# Patient Record
Sex: Female | Born: 1960 | Race: White | Hispanic: No | Marital: Married | Smoking: Former smoker
Health system: Southern US, Community
[De-identification: ages and names within clinical notes are randomized; demographics above are authoritative.]

## PROBLEM LIST (undated history)

## (undated) DIAGNOSIS — F329 Major depressive disorder, single episode, unspecified: Secondary | ICD-10-CM

## (undated) DIAGNOSIS — E119 Type 2 diabetes mellitus without complications: Secondary | ICD-10-CM

## (undated) DIAGNOSIS — F419 Anxiety disorder, unspecified: Secondary | ICD-10-CM

## (undated) DIAGNOSIS — G43909 Migraine, unspecified, not intractable, without status migrainosus: Secondary | ICD-10-CM

## (undated) DIAGNOSIS — F32A Depression, unspecified: Secondary | ICD-10-CM

## (undated) DIAGNOSIS — E079 Disorder of thyroid, unspecified: Secondary | ICD-10-CM

## (undated) HISTORY — DX: Disorder of thyroid, unspecified: E07.9

## (undated) HISTORY — DX: Depression, unspecified: F32.A

## (undated) HISTORY — PX: TONSILLECTOMY: SUR1361

## (undated) HISTORY — PX: BACK SURGERY: SHX140

## (undated) HISTORY — DX: Migraine, unspecified, not intractable, without status migrainosus: G43.909

## (undated) HISTORY — PX: CHOLECYSTECTOMY: SHX55

## (undated) HISTORY — DX: Anxiety disorder, unspecified: F41.9

## (undated) HISTORY — DX: Type 2 diabetes mellitus without complications: E11.9

## (undated) HISTORY — DX: Major depressive disorder, single episode, unspecified: F32.9

## (undated) HISTORY — PX: GYNECOLOGIC CRYOSURGERY: SHX857

## (undated) HISTORY — PX: LAPAROSCOPY: SHX197

---

## 1999-09-03 ENCOUNTER — Encounter: Payer: Self-pay | Admitting: Internal Medicine

## 1999-09-03 ENCOUNTER — Ambulatory Visit (HOSPITAL_COMMUNITY): Admission: RE | Admit: 1999-09-03 | Discharge: 1999-09-03 | Payer: Self-pay | Admitting: Internal Medicine

## 2002-08-05 ENCOUNTER — Emergency Department (HOSPITAL_COMMUNITY): Admission: AD | Admit: 2002-08-05 | Discharge: 2002-08-05 | Payer: Self-pay | Admitting: Emergency Medicine

## 2002-08-05 ENCOUNTER — Encounter: Payer: Self-pay | Admitting: Emergency Medicine

## 2004-05-27 ENCOUNTER — Emergency Department (HOSPITAL_COMMUNITY): Admission: EM | Admit: 2004-05-27 | Discharge: 2004-05-28 | Payer: Self-pay | Admitting: Emergency Medicine

## 2004-11-27 ENCOUNTER — Ambulatory Visit: Payer: Self-pay | Admitting: Internal Medicine

## 2004-12-25 ENCOUNTER — Ambulatory Visit: Payer: Self-pay | Admitting: Internal Medicine

## 2005-03-18 ENCOUNTER — Ambulatory Visit: Payer: Self-pay | Admitting: Internal Medicine

## 2005-07-16 ENCOUNTER — Ambulatory Visit: Payer: Self-pay | Admitting: Internal Medicine

## 2005-08-31 ENCOUNTER — Ambulatory Visit: Payer: Self-pay | Admitting: Internal Medicine

## 2006-01-13 ENCOUNTER — Ambulatory Visit: Payer: Self-pay | Admitting: Internal Medicine

## 2006-03-21 ENCOUNTER — Ambulatory Visit: Payer: Self-pay | Admitting: Internal Medicine

## 2006-04-28 ENCOUNTER — Ambulatory Visit: Payer: Self-pay | Admitting: Endocrinology

## 2006-06-10 ENCOUNTER — Ambulatory Visit (HOSPITAL_COMMUNITY): Admission: RE | Admit: 2006-06-10 | Discharge: 2006-06-10 | Payer: Self-pay | Admitting: Oncology

## 2006-10-05 ENCOUNTER — Ambulatory Visit: Payer: Self-pay | Admitting: Internal Medicine

## 2006-10-27 ENCOUNTER — Ambulatory Visit: Payer: Self-pay | Admitting: Internal Medicine

## 2006-12-05 ENCOUNTER — Encounter: Payer: Self-pay | Admitting: *Deleted

## 2006-12-05 DIAGNOSIS — G43909 Migraine, unspecified, not intractable, without status migrainosus: Secondary | ICD-10-CM | POA: Insufficient documentation

## 2006-12-05 DIAGNOSIS — F331 Major depressive disorder, recurrent, moderate: Secondary | ICD-10-CM | POA: Insufficient documentation

## 2006-12-05 DIAGNOSIS — F4323 Adjustment disorder with mixed anxiety and depressed mood: Secondary | ICD-10-CM

## 2007-01-11 ENCOUNTER — Ambulatory Visit: Payer: Self-pay | Admitting: Internal Medicine

## 2007-01-11 ENCOUNTER — Encounter (INDEPENDENT_AMBULATORY_CARE_PROVIDER_SITE_OTHER): Payer: Self-pay | Admitting: *Deleted

## 2007-01-11 DIAGNOSIS — F519 Sleep disorder not due to a substance or known physiological condition, unspecified: Secondary | ICD-10-CM | POA: Insufficient documentation

## 2007-01-16 LAB — CONVERTED CEMR LAB
ALT: 31 units/L (ref 0–35)
Alkaline Phosphatase: 73 units/L (ref 39–117)
BUN: 10 mg/dL (ref 6–23)
Basophils Absolute: 0 10*3/uL (ref 0.0–0.1)
Bilirubin, Direct: 0.1 mg/dL (ref 0.0–0.3)
Calcium: 9.2 mg/dL (ref 8.4–10.5)
Eosinophils Absolute: 0.2 10*3/uL (ref 0.0–0.6)
GFR calc Af Amer: 99 mL/min
GFR calc non Af Amer: 82 mL/min
Lymphocytes Relative: 28.4 % (ref 12.0–46.0)
MCV: 89.5 fL (ref 78.0–100.0)
Monocytes Relative: 6 % (ref 3.0–11.0)
Neutro Abs: 5.5 10*3/uL (ref 1.4–7.7)
Platelets: 212 10*3/uL (ref 150–400)

## 2007-01-31 ENCOUNTER — Ambulatory Visit: Payer: Self-pay | Admitting: Internal Medicine

## 2007-01-31 DIAGNOSIS — E063 Autoimmune thyroiditis: Secondary | ICD-10-CM

## 2007-12-22 ENCOUNTER — Emergency Department (HOSPITAL_COMMUNITY): Admission: EM | Admit: 2007-12-22 | Discharge: 2007-12-22 | Payer: Self-pay | Admitting: Emergency Medicine

## 2009-06-20 ENCOUNTER — Emergency Department (HOSPITAL_COMMUNITY): Admission: EM | Admit: 2009-06-20 | Discharge: 2009-06-20 | Payer: Self-pay | Admitting: Emergency Medicine

## 2009-07-02 ENCOUNTER — Encounter: Payer: Self-pay | Admitting: Internal Medicine

## 2009-11-21 ENCOUNTER — Encounter: Payer: Self-pay | Admitting: Internal Medicine

## 2009-11-25 ENCOUNTER — Inpatient Hospital Stay (HOSPITAL_COMMUNITY): Admission: AD | Admit: 2009-11-25 | Discharge: 2009-11-26 | Payer: Self-pay | Admitting: *Deleted

## 2009-11-25 ENCOUNTER — Encounter: Payer: Self-pay | Admitting: Internal Medicine

## 2009-12-30 ENCOUNTER — Telehealth (INDEPENDENT_AMBULATORY_CARE_PROVIDER_SITE_OTHER): Payer: Self-pay | Admitting: *Deleted

## 2010-04-09 NOTE — Letter (Signed)
Summary: [facility]  [facility]   Imported By: Sherian Rein 07/18/2009 14:55:38  _____________________________________________________________________  External Attachment:    Type:   Image     Comment:   External Document

## 2010-04-09 NOTE — Letter (Signed)
Summary: [facility]  [facility]   Imported By: Sherian Rein 12/02/2009 15:07:58  _____________________________________________________________________  External Attachment:    Type:   Image     Comment:   External Document

## 2010-04-09 NOTE — Letter (Signed)
Summary: [facility]  [facility]   Imported By: Lester Ewa Gentry 12/08/2009 07:23:52  _____________________________________________________________________  External Attachment:    Type:   Image     Comment:   External Document

## 2010-04-09 NOTE — Progress Notes (Signed)
  Phone Note Other Incoming   Request: Send information Summary of Call: Request for records received from [employer]. Request forwarded to [employer].     

## 2010-04-11 ENCOUNTER — Encounter: Payer: Self-pay | Admitting: Family Medicine

## 2010-04-11 ENCOUNTER — Ambulatory Visit (INDEPENDENT_AMBULATORY_CARE_PROVIDER_SITE_OTHER): Payer: PRIVATE HEALTH INSURANCE | Admitting: Family Medicine

## 2010-04-11 DIAGNOSIS — J069 Acute upper respiratory infection, unspecified: Secondary | ICD-10-CM

## 2010-04-13 ENCOUNTER — Telehealth: Payer: Self-pay | Admitting: Internal Medicine

## 2010-04-15 ENCOUNTER — Ambulatory Visit (INDEPENDENT_AMBULATORY_CARE_PROVIDER_SITE_OTHER): Payer: PRIVATE HEALTH INSURANCE | Admitting: Internal Medicine

## 2010-04-15 ENCOUNTER — Encounter: Payer: Self-pay | Admitting: Internal Medicine

## 2010-04-15 DIAGNOSIS — R05 Cough: Secondary | ICD-10-CM

## 2010-04-15 DIAGNOSIS — J019 Acute sinusitis, unspecified: Secondary | ICD-10-CM

## 2010-04-23 NOTE — Assessment & Plan Note (Signed)
Summary: FEVER,COUGH/CONGESTION   Vital Signs:  Patient profile:   50 year old female Height:      69 inches Weight:      216.25 pounds BMI:     32.05 Temp:     98.0 degrees F oral Pulse rate:   80 / minute Pulse rhythm:   regular BP sitting:   108 / 60  (left arm) Cuff size:   large  Vitals Entered By: Selena Batten Dance CMA Duncan Dull) (April 11, 2010 9:44 AM) CC: Fever, cough   History of Present Illness: started getting sick thurs am - migraine type ha as the day went on started coughing and feeling sick  fever started 100.3  nasal congestion / sore throat and ear pain  no fever right now -- last ibuprofen last night - nothing today   cough is dry   grandkids had a stomach bug and also colds   some loose stool no further vomiting - but poor appetite   taking ibuprofen and delsym for symptoms   Allergies (verified): No Known Drug Allergies  Past History:  Past Medical History: Last updated: 01/31/2007 Depression/ greif Hypertension Anxiety migraines Hypothyroidism 01-Aug-2006  Past Surgical History: Last updated: 01/11/2007 Tonsillectomy Cholecystectomy  Family History: Last updated: 01/11/2007 Family History Lung cancer, heart arrythmia mother with lung cancer  Social History: Last updated: 01/31/2007 Former Smoker Alcohol use-yes 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 Eyes:  Denies blurring, double vision, and eye irritation. ENT:  Complains of nasal congestion, postnasal drainage, sinus pressure, and sore throat. CV:  Denies chest pain or discomfort and palpitations. Resp:  Complains of cough and sputum productive; denies pleuritic, shortness of breath, and wheezing. GI:  Denies diarrhea, nausea, and vomiting. Derm:  Denies itching, lesion(s), poor wound healing, and rash. Neuro:  Denies numbness and tingling.  Physical  Exam  General:  overweight but generally well appearing  Head:  normocephalic, atraumatic, and no abnormalities observed.  no sinus tenderness Eyes:  vision grossly intact, pupils equal, pupils round, pupils reactive to light, and no injection.   Ears:  TMs dull bilat  Nose:  nares are injected and congested bilaterally  Mouth:  pharynx pink and moist, no erythema, and no exudates.   Neck:  supple with full rom and no masses or thyromegally, no JVD or carotid bruit  Lungs:  Normal respiratory effort, chest expands symmetrically. Lungs are clear to auscultation, no crackles or wheezes. Heart:  Normal rate and regular rhythm. S1 and S2 normal without gallop, murmur, click, rub or other extra sounds. Skin:  Intact without suspicious lesions or rashes Cervical Nodes:  No lymphadenopathy noted Psych:  normal affect, talkative and pleasant    Impression & Recommendations:  Problem # 1:  VIRAL URI (ICD-465.9) Assessment New with fever - now regressing and nasal and chest congestion  recommend sympt care- see pt instructions   pt advised to update me if symptoms worsen or do not improve - esp if worse fever or cough guifen - codiene as needed - warned of sedation  Her updated medication list for this problem includes:    Guaifenesin Ac 100-10 Mg/30ml Syrp (Guaifenesin-codeine) .Marland Kitchen... 1-2 teaspoons up to every 4-6 hours as needed cough -at night or when not driving - will sedate  Complete Medication List: 1)  Guaifenesin Ac 100-10 Mg/49ml Syrp (Guaifenesin-codeine) .Marland Kitchen.. 1-2 teaspoons up to every 4-6 hours  as needed cough -at night or when not driving - will sedate  Patient Instructions: 1)  I think you have a viral syndrome  2)  you can try mucinex over the counter twice daily as directed and nasal saline spray for congestion 3)  tylenol over the counter as directed may help with aches, headache and fever 4)  call if symptoms worsen or if not improved in 4-5 days 5)  try the codiene cough  syrup at night or when not driving Prescriptions: GUAIFENESIN AC 100-10 MG/5ML SYRP (GUAIFENESIN-CODEINE) 1-2 teaspoons up to every 4-6 hours as needed cough -at night or when not driving - will sedate  #161WR x 0   Entered and Authorized by:   Judith Part MD   Signed by:   Judith Part MD on 04/11/2010   Method used:   Print then Give to Patient   RxID:   [id number]    Orders Added: 1)  Est. Patient Level III [95621]    Prior Medications: Current Allergies (reviewed today): No known allergies

## 2010-04-23 NOTE — Progress Notes (Signed)
Summary: REQ FOR RX  Phone Note Call from Patient   Summary of Call: Pt now coughing up green mucus and chest tightness. She is req rx for antibiotic - was seen a sat clinic and given rx for cough med only.  Initial call taken by: Lamar Sprinkles, CMA,  April 13, 2010 9:27 AM  Follow-up for Phone Call        ok z pac OV if sick Follow-up by: Tresa Garter MD,  April 13, 2010 6:02 PM  Additional Follow-up for Phone Call Additional follow up Details #1::        Pt informed  Additional Follow-up by: Lamar Sprinkles, CMA,  April 13, 2010 6:53 PM    New/Updated Medications: ZITHROMAX Z-PAK 250 MG TABS (AZITHROMYCIN) as dirrected Prescriptions: ZITHROMAX Z-PAK 250 MG TABS (AZITHROMYCIN) as dirrected  #1 x 0   Entered by:   Lamar Sprinkles, CMA   Authorized by:   Tresa Garter MD   Signed by:   Lamar Sprinkles, CMA on 04/13/2010   Method used:   Electronically to        [facility] (retail)       [address]       [place], [place]  [address]       Ph: [phone]       Fax: [phone]   RxID:   [id number]

## 2010-04-23 NOTE — Assessment & Plan Note (Signed)
Summary: LAST OV 2008--COUGH-CONGESTION  STC   Vital Signs:  Patient profile:   50 year old female Height:      69 inches Weight:      218 pounds BMI:     32.31 Temp:     98.8 degrees F oral Pulse rate:   88 / minute Pulse rhythm:   regular Resp:     16 per minute BP sitting:   158 / 120  (left arm) Cuff size:   large  Vitals Entered By: Lanier Prude, CMA(AAMA) (April 15, 2010 4:53 PM) CC: weakness, fatigue, aches, cough and congestion Is Patient Diabetic? No   CC:  weakness, fatigue, aches, and cough and congestion.  History of Present Illness: The patient presents with complaints of sore throat, fever, cough, sinus congestion and drainge of seven days duration. Not better with OTC meds. Muscle aches are present.  The mucus is colored.   Current Medications (verified): 1)  Guaifenesin Ac 100-10 Mg/32ml Syrp (Guaifenesin-Codeine) .Marland Kitchen.. 1-2 Teaspoons Up To Every 4-6 Hours As Needed Cough -At Night or When Not Driving - Will Sedate 2)  Zithromax Z-Pak 250 Mg Tabs (Azithromycin) .... As Dirrected 3)  Mucinex 600 Mg Xr12h-Tab (Guaifenesin) .... 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  The patient denies chest pain and prolonged cough.         cough  Physical Exam  General:  Looks tired Mouth:  Erythematous throat and intranasal mucosa c/w URI  Lungs:  Normal respiratory effort, chest expands symmetrically. Lungs are clear to auscultation, no crackles or wheezes. Heart:  Normal rate and regular rhythm. S1 and S2 normal without gallop, murmur, click, rub or other extra sounds. Skin:  Intact without suspicious lesions or rashes Psych:   normal affect, talkative and pleasant    Impression & Recommendations:  Problem # 1:  SINUSITIS, ACUTE (ICD-461.9) Assessment New  The following medications were removed from the medication list:    Guaifenesin Ac 100-10 Mg/90ml Syrp (Guaifenesin-codeine) .Marland Kitchen... 1-2 teaspoons up to every 4-6 hours as needed cough -at night or when not driving - will sedate Her updated medication list for this problem includes:    Mucinex 600 Mg Xr12h-tab (Guaifenesin) .Marland Kitchen... As directed    Tussionex Pennkinetic Er 8-10 Mg/30ml Lqcr (Chlorpheniramine-hydrocodone) .Marland KitchenMarland KitchenMarland KitchenMarland Kitchen 5 ml by mouth two times a day as needed cough    Augmentin 875-125 Mg Tabs (Amoxicillin-pot clavulanate) .Marland Kitchen... 1 by mouth bid  Problem # 2:  COUGH (ICD-786.2) due to #1 Assessment: New  Complete Medication List: 1)  Mucinex 600 Mg Xr12h-tab (Guaifenesin) .... As directed 2)  Tussionex Pennkinetic Er 8-10 Mg/85ml Lqcr (Chlorpheniramine-hydrocodone) .... 5 ml by mouth two times a day as needed cough 3)  Augmentin 875-125 Mg Tabs (Amoxicillin-pot clavulanate) .Marland Kitchen.. 1 by mouth bid  Patient Instructions: 1)  Use over-the-counter medicines for "cold": Tylenol  650mg  or Advil 400mg  every 6 hours  for fever; Delsym or Robutussin for cough. Mucinex  for congestion. Ricola or Halls for sore throat. Office visit if not better or if worse.  2)  Check BP at home 3)  Use the Sinus rinse as needed  Prescriptions: AUGMENTIN 875-125 MG TABS (AMOXICILLIN-POT CLAVULANATE) 1 by mouth bid  #  20 x 0   Entered and Authorized by:   Tresa Garter MD   Signed by:   Tresa Garter MD on 04/15/2010   Method used:   Print then Give to Patient   RxID:   [id number] TUSSIONEX PENNKINETIC ER 8-10 MG/5ML LQCR (CHLORPHENIRAMINE-HYDROCODONE) 5 ml by mouth two times a day as needed cough  #100 ml x 0   Entered and Authorized by:   Tresa Garter MD   Signed by:   Tresa Garter MD on 04/15/2010   Method used:   Print then Give to Patient   RxID:    [id number]    Orders Added: 1)  Est. Patient Level III [24401]

## 2010-05-21 LAB — URINALYSIS, ROUTINE W REFLEX MICROSCOPIC
Bilirubin Urine: NEGATIVE
Glucose, UA: NEGATIVE mg/dL
Hgb urine dipstick: NEGATIVE
Ketones, ur: NEGATIVE mg/dL
Protein, ur: NEGATIVE mg/dL

## 2010-05-21 LAB — CBC
Hemoglobin: 14.6 g/dL (ref 12.0–15.0)
MCH: 31.7 pg (ref 26.0–34.0)
MCV: 90 fL (ref 78.0–100.0)
RBC: 4.61 MIL/uL (ref 3.87–5.11)

## 2010-05-21 LAB — DIFFERENTIAL
Basophils Absolute: 0.1 10*3/uL (ref 0.0–0.1)
Basophils Relative: 1 % (ref 0–1)
Eosinophils Absolute: 0.1 10*3/uL (ref 0.0–0.7)
Eosinophils Relative: 2 % (ref 0–5)
Lymphocytes Relative: 29 % (ref 12–46)

## 2010-05-21 LAB — COMPREHENSIVE METABOLIC PANEL
ALT: 27 U/L (ref 0–35)
AST: 34 U/L (ref 0–37)
Alkaline Phosphatase: 77 U/L (ref 39–117)
CO2: 27 mEq/L (ref 19–32)
Chloride: 103 mEq/L (ref 96–112)
Creatinine, Ser: 0.83 mg/dL (ref 0.4–1.2)
GFR calc Af Amer: >60 mL/min (ref >60)
GFR calc non Af Amer: >60 mL/min (ref >60)
Total Bilirubin: 1.1 mg/dL (ref 0.3–1.2)

## 2010-05-21 LAB — ABO/RH: ABO/RH(D): O POS

## 2010-05-21 LAB — TYPE AND SCREEN

## 2010-05-21 LAB — PROTIME-INR: Prothrombin Time: 12.9 seconds (ref 11.6–15.2)

## 2010-05-21 LAB — SURGICAL PCR SCREEN: MRSA, PCR: NEGATIVE

## 2010-08-14 ENCOUNTER — Other Ambulatory Visit: Payer: Self-pay | Admitting: Obstetrics and Gynecology

## 2010-08-14 DIAGNOSIS — N644 Mastodynia: Secondary | ICD-10-CM

## 2010-08-18 ENCOUNTER — Ambulatory Visit
Admission: RE | Admit: 2010-08-18 | Discharge: 2010-08-18 | Disposition: A | Discharge status: Home / Self Care | Payer: PRIVATE HEALTH INSURANCE | Source: Ambulatory Visit | Attending: Obstetrics and Gynecology | Admitting: Obstetrics and Gynecology

## 2010-08-18 ENCOUNTER — Other Ambulatory Visit: Payer: Self-pay | Admitting: Obstetrics and Gynecology

## 2010-08-18 DIAGNOSIS — N644 Mastodynia: Secondary | ICD-10-CM

## 2010-08-25 ENCOUNTER — Telehealth: Payer: Self-pay | Admitting: Internal Medicine

## 2010-08-25 ENCOUNTER — Other Ambulatory Visit (INDEPENDENT_AMBULATORY_CARE_PROVIDER_SITE_OTHER): Payer: PRIVATE HEALTH INSURANCE

## 2010-08-25 ENCOUNTER — Ambulatory Visit (INDEPENDENT_AMBULATORY_CARE_PROVIDER_SITE_OTHER): Payer: PRIVATE HEALTH INSURANCE | Admitting: Internal Medicine

## 2010-08-25 ENCOUNTER — Encounter: Payer: Self-pay | Admitting: Internal Medicine

## 2010-08-25 ENCOUNTER — Other Ambulatory Visit (INDEPENDENT_AMBULATORY_CARE_PROVIDER_SITE_OTHER): Payer: PRIVATE HEALTH INSURANCE | Admitting: Internal Medicine

## 2010-08-25 VITALS — BP 130/90 | HR 80 | Temp 98.2°F | Resp 16 | Ht 69.0 in | Wt 203.0 lb

## 2010-08-25 DIAGNOSIS — Z0000 Encounter for general adult medical examination without abnormal findings: Secondary | ICD-10-CM

## 2010-08-25 DIAGNOSIS — G43009 Migraine without aura, not intractable, without status migrainosus: Secondary | ICD-10-CM

## 2010-08-25 DIAGNOSIS — R110 Nausea: Secondary | ICD-10-CM | POA: Insufficient documentation

## 2010-08-25 DIAGNOSIS — E039 Hypothyroidism, unspecified: Secondary | ICD-10-CM

## 2010-08-25 DIAGNOSIS — G43909 Migraine, unspecified, not intractable, without status migrainosus: Secondary | ICD-10-CM

## 2010-08-25 LAB — COMPREHENSIVE METABOLIC PANEL
AST: 22 U/L (ref 0–37)
Albumin: 4.3 g/dL (ref 3.5–5.2)
BUN: 9 mg/dL (ref 6–23)
Calcium: 9.1 mg/dL (ref 8.4–10.5)
Chloride: 107 mEq/L (ref 96–112)
Glucose, Bld: 121 mg/dL — ABNORMAL HIGH (ref 70–99)
Potassium: 4.2 mEq/L (ref 3.5–5.1)
Sodium: 138 mEq/L (ref 135–145)
Total Protein: 7.1 g/dL (ref 6.0–8.3)

## 2010-08-25 LAB — URINALYSIS, ROUTINE W REFLEX MICROSCOPIC
Nitrite: NEGATIVE
Specific Gravity, Urine: 1.02 (ref 1.000–1.030)
Urine Glucose: NEGATIVE
Urobilinogen, UA: 0.2 (ref 0.0–1.0)

## 2010-08-25 LAB — CBC WITH DIFFERENTIAL/PLATELET
Basophils Absolute: 0 10*3/uL (ref 0.0–0.1)
Basophils Relative: 0.6 % (ref 0.0–3.0)
Eosinophils Absolute: 0.1 10*3/uL (ref 0.0–0.7)
Lymphocytes Relative: 23.5 % (ref 12.0–46.0)
MCHC: 34.6 g/dL (ref 30.0–36.0)
Monocytes Absolute: 0.5 10*3/uL (ref 0.1–1.0)
Neutrophils Relative %: 69.3 % (ref 43.0–77.0)
Platelets: 202 10*3/uL (ref 150.0–400.0)
RBC: 4.54 Mil/uL (ref 3.87–5.11)
WBC: 8.4 10*3/uL (ref 4.5–10.5)

## 2010-08-25 LAB — TSH: TSH: 19.27 u[IU]/mL — ABNORMAL HIGH (ref 0.35–5.50)

## 2010-08-25 MED ORDER — CIPROFLOXACIN HCL 250 MG PO TABS: 250.0000 mg | ORAL_TABLET | Freq: Two times a day (BID) | ORAL | Status: AC

## 2010-08-25 MED ORDER — PROTRIPTYLINE HCL 10 MG PO TABS: ORAL_TABLET | ORAL | Status: DC

## 2010-08-25 MED ORDER — OXYCODONE-ACETAMINOPHEN 10-325 MG PO TABS: 1.0000 | ORAL_TABLET | Freq: Three times a day (TID) | ORAL | Status: DC

## 2010-08-25 MED ORDER — PROMETHAZINE HCL 12.5 MG PO TABS: 12.5000 mg | ORAL_TABLET | Freq: Four times a day (QID) | ORAL | Status: AC | PRN

## 2010-08-25 MED ORDER — VITAMIN D 1000 UNITS PO TABS: 1000.0000 [IU] | ORAL_TABLET | Freq: Every day | ORAL | Status: DC

## 2010-08-25 MED ORDER — LEVOTHYROXINE SODIUM 50 MCG PO TABS: 50.0000 ug | ORAL_TABLET | Freq: Every day | ORAL | Status: DC

## 2010-08-25 NOTE — Telephone Encounter (Signed)
Autumn Howard, please, inform patient that all labs are normal except for elev TSH and chol  Please, mail the labs to the patient. Start Lvoxyl 50 mcg a day. TSH in 6 wks

## 2010-08-25 NOTE — Assessment & Plan Note (Signed)
Worse. Start Vivactil - it helped before Triptan intolerant

## 2010-08-25 NOTE — Assessment & Plan Note (Signed)
Worse. Start Vivactil Advil Oxy/APAP for severe HAs

## 2010-08-25 NOTE — Progress Notes (Signed)
  Subjective:    Patient ID: Autumn Howard, female    DOB: 17-Dec-1960, 50 y.o.   MRN: [id number]  HPI C/o HA with nausea - bad (9/10) since this am. She took Percocet - it helped. C/o frequent migraines She sch well visit w/Dr Ambrose Mantle   Review of Systems  Constitutional: Positive for unexpected weight change (gained some wt). Negative for chills, activity change, appetite change and fatigue.  HENT: Negative for congestion, mouth sores and sinus pressure.   Eyes: Negative for visual disturbance.  Respiratory: Negative for cough and chest tightness.   Gastrointestinal: Negative for nausea and abdominal pain.  Genitourinary: Negative for frequency, difficulty urinating and vaginal pain.  Musculoskeletal: Negative for back pain and gait problem.  Skin: Negative for pallor and rash.  Neurological: Positive for headaches. Negative for dizziness, tremors, weakness and numbness.  Psychiatric/Behavioral: Negative for confusion and sleep disturbance.       Objective:   Physical Exam  Constitutional: She appears well-developed and well-nourished. No distress.       Obese, in NAD  HENT:  Head: Normocephalic.  Right Ear: External ear normal.  Left Ear: External ear normal.  Nose: Nose normal.  Mouth/Throat: Oropharynx is clear and moist.  Eyes: Conjunctivae are normal. Pupils are equal, round, and reactive to light. Right eye exhibits no discharge. Left eye exhibits no discharge.  Neck: Normal range of motion. Neck supple. No JVD present. No tracheal deviation present. No thyromegaly present.  Cardiovascular: Normal rate, regular rhythm and normal heart sounds.   Pulmonary/Chest: No stridor. No respiratory distress. She has no wheezes.  Abdominal: Soft. Bowel sounds are normal. She exhibits no distension and no mass. There is no tenderness. There is no rebound and no guarding.  Musculoskeletal: She exhibits no edema and no tenderness.       Neck is supple  Lymphadenopathy:    She has no  cervical adenopathy.  Neurological: She displays normal reflexes. No cranial nerve deficit. She exhibits normal muscle tone. Coordination normal.  Skin: No rash noted. No erythema.  Psychiatric: Her behavior is normal. Judgment and thought content normal.       sad          Assessment & Plan:

## 2010-08-25 NOTE — Telephone Encounter (Signed)
UTI - take Cipro

## 2010-08-25 NOTE — Assessment & Plan Note (Signed)
Due to HA Promethazine Rx

## 2010-08-26 NOTE — Telephone Encounter (Signed)
Pt informed

## 2010-10-09 ENCOUNTER — Other Ambulatory Visit (INDEPENDENT_AMBULATORY_CARE_PROVIDER_SITE_OTHER): Payer: PRIVATE HEALTH INSURANCE

## 2010-10-09 DIAGNOSIS — E039 Hypothyroidism, unspecified: Secondary | ICD-10-CM

## 2010-10-12 ENCOUNTER — Encounter: Payer: Self-pay | Admitting: Internal Medicine

## 2010-10-23 DIAGNOSIS — Z0279 Encounter for issue of other medical certificate: Secondary | ICD-10-CM

## 2010-10-27 ENCOUNTER — Telehealth: Payer: Self-pay | Admitting: *Deleted

## 2010-10-27 DIAGNOSIS — E039 Hypothyroidism, unspecified: Secondary | ICD-10-CM

## 2010-10-27 NOTE — Telephone Encounter (Signed)
Dr Debby Bud addressed on 8/6. TSH was better. I would take 75 mcg a day and recheck TSH in 1 mo Thx

## 2010-10-27 NOTE — Telephone Encounter (Signed)
Patient requesting results of labs.  

## 2010-10-28 MED ORDER — LEVOTHYROXINE SODIUM 75 MCG PO TABS: 75.0000 ug | ORAL_TABLET | Freq: Every day | ORAL | Status: DC

## 2010-10-28 NOTE — Telephone Encounter (Signed)
Pt informed

## 2011-04-26 ENCOUNTER — Telehealth: Payer: Self-pay | Admitting: Internal Medicine

## 2011-04-26 ENCOUNTER — Ambulatory Visit: Payer: PRIVATE HEALTH INSURANCE | Admitting: Internal Medicine

## 2011-04-26 ENCOUNTER — Other Ambulatory Visit (INDEPENDENT_AMBULATORY_CARE_PROVIDER_SITE_OTHER): Payer: PRIVATE HEALTH INSURANCE

## 2011-04-26 ENCOUNTER — Encounter: Payer: Self-pay | Admitting: Internal Medicine

## 2011-04-26 ENCOUNTER — Ambulatory Visit (INDEPENDENT_AMBULATORY_CARE_PROVIDER_SITE_OTHER): Payer: PRIVATE HEALTH INSURANCE | Admitting: Internal Medicine

## 2011-04-26 VITALS — BP 130/90 | HR 76 | Temp 97.7°F | Resp 16 | Wt 197.0 lb

## 2011-04-26 DIAGNOSIS — E039 Hypothyroidism, unspecified: Secondary | ICD-10-CM

## 2011-04-26 DIAGNOSIS — F329 Major depressive disorder, single episode, unspecified: Secondary | ICD-10-CM

## 2011-04-26 DIAGNOSIS — F3289 Other specified depressive episodes: Secondary | ICD-10-CM

## 2011-04-26 DIAGNOSIS — G43009 Migraine without aura, not intractable, without status migrainosus: Secondary | ICD-10-CM

## 2011-04-26 DIAGNOSIS — G43909 Migraine, unspecified, not intractable, without status migrainosus: Secondary | ICD-10-CM

## 2011-04-26 LAB — TSH: TSH: 11.05 u[IU]/mL — ABNORMAL HIGH (ref 0.35–5.50)

## 2011-04-26 LAB — URINALYSIS, ROUTINE W REFLEX MICROSCOPIC
Ketones, ur: NEGATIVE
Leukocytes, UA: NEGATIVE
Specific Gravity, Urine: 1.02 (ref 1.000–1.030)
Urine Glucose: NEGATIVE
pH: 5 (ref 5.0–8.0)

## 2011-04-26 LAB — BASIC METABOLIC PANEL
CO2: 28 mEq/L (ref 19–32)
Chloride: 102 mEq/L (ref 96–112)
GFR: 89.16 mL/min (ref >60.00)
Glucose, Bld: 127 mg/dL — ABNORMAL HIGH (ref 70–99)
Potassium: 3.8 mEq/L (ref 3.5–5.1)
Sodium: 136 mEq/L (ref 135–145)

## 2011-04-26 MED ORDER — LEVOTHYROXINE SODIUM 112 MCG PO TABS: 112.0000 ug | ORAL_TABLET | Freq: Every day | ORAL | Status: DC

## 2011-04-26 MED ORDER — PROTRIPTYLINE HCL 10 MG PO TABS: 40.0000 mg | ORAL_TABLET | Freq: Every day | ORAL | Status: DC

## 2011-04-26 MED ORDER — LEVOTHYROXINE SODIUM 75 MCG PO TABS: 75.0000 ug | ORAL_TABLET | Freq: Every day | ORAL | Status: DC

## 2011-04-26 NOTE — Assessment & Plan Note (Signed)
Check labs 

## 2011-04-26 NOTE — Telephone Encounter (Signed)
Misty Stanley, please, inform patient that all labs are normal except for elev glu and elev TSH Start synthroid 112 mcg a day Check tsh in 6 wks Thx

## 2011-04-26 NOTE — Assessment & Plan Note (Signed)
Chronic - on Vivactil Percocet prn

## 2011-04-26 NOTE — Progress Notes (Signed)
  Subjective:    Patient ID: Autumn Howard, female    DOB: Aug 02, 1960, 51 y.o.   MRN: [id number]  HPI   The patient is here to follow up on chronic depression, anxiety, headaches  C/o insomnia; grief is worse     Review of Systems  Constitutional: Positive for fatigue. Negative for chills, activity change, appetite change and unexpected weight change.  HENT: Negative for congestion, mouth sores and sinus pressure.   Eyes: Negative for visual disturbance.  Respiratory: Negative for cough and chest tightness.   Gastrointestinal: Negative for nausea and abdominal pain.  Genitourinary: Negative for frequency, difficulty urinating and vaginal pain.  Musculoskeletal: Negative for back pain and gait problem.  Skin: Negative for pallor and rash.  Neurological: Positive for headaches. Negative for dizziness, tremors, weakness and numbness.  Psychiatric/Behavioral: Negative for suicidal ideas, confusion and sleep disturbance. The patient is nervous/anxious.        Objective:   Physical Exam  Constitutional: She appears well-developed. No distress.  HENT:  Head: Normocephalic.  Right Ear: External ear normal.  Left Ear: External ear normal.  Nose: Nose normal.  Mouth/Throat: Oropharynx is clear and moist.  Eyes: Conjunctivae are normal. Pupils are equal, round, and reactive to light. Right eye exhibits no discharge. Left eye exhibits no discharge.  Neck: Normal range of motion. Neck supple. No JVD present. No tracheal deviation present. No thyromegaly present.  Cardiovascular: Normal rate, regular rhythm and normal heart sounds.   Pulmonary/Chest: No stridor. No respiratory distress. She has no wheezes.  Abdominal: Soft. Bowel sounds are normal. She exhibits no distension and no mass. There is no tenderness. There is no rebound and no guarding.  Musculoskeletal: She exhibits no edema and no tenderness.  Lymphadenopathy:    She has no cervical adenopathy.  Neurological: She displays  normal reflexes. No cranial nerve deficit. She exhibits normal muscle tone. Coordination normal.  Skin: No rash noted. No erythema.  Psychiatric: She has a normal mood and affect. Her behavior is normal. Judgment and thought content normal.          Assessment & Plan:

## 2011-04-26 NOTE — Patient Instructions (Signed)
Wt Readings from Last 3 Encounters:  04/26/11 197 lb (89.359 kg)  08/25/10 203 lb (92.08 kg)  04/15/10 218 lb (98.884 kg)   BP Readings from Last 3 Encounters:  04/26/11 130/90  08/25/10 130/90  04/15/10 158/120

## 2011-04-26 NOTE — Assessment & Plan Note (Addendum)
Grief - mother died in 2008 Increase Vivactil to 40 mg q hs

## 2011-04-28 MED ORDER — LEVOTHYROXINE SODIUM 112 MCG PO TABS: 112.0000 ug | ORAL_TABLET | Freq: Every day | ORAL | Status: DC

## 2011-04-28 NOTE — Telephone Encounter (Signed)
Pt informed

## 2011-07-07 ENCOUNTER — Other Ambulatory Visit (INDEPENDENT_AMBULATORY_CARE_PROVIDER_SITE_OTHER): Payer: PRIVATE HEALTH INSURANCE

## 2011-07-07 ENCOUNTER — Telehealth: Payer: Self-pay | Admitting: Internal Medicine

## 2011-07-07 DIAGNOSIS — E039 Hypothyroidism, unspecified: Secondary | ICD-10-CM

## 2011-07-07 MED ORDER — LEVOTHYROXINE SODIUM 125 MCG PO TABS: 125.0000 ug | ORAL_TABLET | Freq: Every day | ORAL | Status: DC

## 2011-07-07 NOTE — Telephone Encounter (Signed)
Stacey, please, inform patient that her TSH is better -take levothr 125 mcg a day TSH in 3 mo Thx

## 2011-07-07 NOTE — Telephone Encounter (Signed)
Left mess for patient to call back.  

## 2011-07-08 NOTE — Telephone Encounter (Signed)
Left message on home and cell # to return my call. 

## 2011-07-09 NOTE — Telephone Encounter (Signed)
Pt informed

## 2011-08-06 ENCOUNTER — Other Ambulatory Visit: Payer: Self-pay | Admitting: Obstetrics and Gynecology

## 2011-08-06 DIAGNOSIS — Z1231 Encounter for screening mammogram for malignant neoplasm of breast: Secondary | ICD-10-CM

## 2011-08-26 ENCOUNTER — Ambulatory Visit
Admission: RE | Admit: 2011-08-26 | Discharge: 2011-08-26 | Disposition: A | Discharge status: Home / Self Care | Payer: PRIVATE HEALTH INSURANCE | Source: Ambulatory Visit | Attending: Obstetrics and Gynecology | Admitting: Obstetrics and Gynecology

## 2011-08-26 DIAGNOSIS — Z1231 Encounter for screening mammogram for malignant neoplasm of breast: Secondary | ICD-10-CM

## 2011-09-24 ENCOUNTER — Encounter: Payer: Self-pay | Admitting: Internal Medicine

## 2011-09-24 ENCOUNTER — Ambulatory Visit (INDEPENDENT_AMBULATORY_CARE_PROVIDER_SITE_OTHER): Payer: PRIVATE HEALTH INSURANCE | Admitting: Internal Medicine

## 2011-09-24 VITALS — BP 138/82 | HR 95 | Temp 97.8°F | Resp 16 | Wt 192.0 lb

## 2011-09-24 DIAGNOSIS — F411 Generalized anxiety disorder: Secondary | ICD-10-CM

## 2011-09-24 DIAGNOSIS — E039 Hypothyroidism, unspecified: Secondary | ICD-10-CM

## 2011-09-24 DIAGNOSIS — F329 Major depressive disorder, single episode, unspecified: Secondary | ICD-10-CM

## 2011-09-24 DIAGNOSIS — R5383 Other fatigue: Secondary | ICD-10-CM

## 2011-09-24 MED ORDER — ESCITALOPRAM OXALATE 10 MG PO TABS: 10.0000 mg | ORAL_TABLET | Freq: Every day | ORAL | Status: DC

## 2011-09-24 MED ORDER — LORAZEPAM 0.5 MG PO TABS: 0.5000 mg | ORAL_TABLET | Freq: Two times a day (BID) | ORAL | Status: AC | PRN

## 2011-09-24 NOTE — Progress Notes (Signed)
   Subjective:    Patient ID: Autumn Howard, female    DOB: 05-14-1960, 51 y.o.   MRN: [id number]  HPI   The patient is here to follow up on chronic depression, anxiety, headaches  C/o insomnia; grief is worse  Wt Readings from Last 3 Encounters:  09/24/11 192 lb (87.091 kg)  04/26/11 197 lb (89.359 kg)  08/25/10 203 lb (92.08 kg)   BP Readings from Last 3 Encounters:  09/24/11 138/82  04/26/11 130/90  08/25/10 130/90        Review of Systems  Constitutional: Positive for fatigue. Negative for chills, activity change, appetite change and unexpected weight change.  HENT: Negative for congestion, mouth sores and sinus pressure.   Eyes: Negative for visual disturbance.  Respiratory: Negative for cough and chest tightness.   Gastrointestinal: Negative for nausea and abdominal pain.  Genitourinary: Negative for frequency, difficulty urinating and vaginal pain.  Musculoskeletal: Negative for back pain and gait problem.  Skin: Negative for pallor and rash.  Neurological: Positive for headaches. Negative for dizziness, tremors, weakness and numbness.  Psychiatric/Behavioral: Negative for suicidal ideas, confusion and disturbed wake/sleep cycle. The patient is nervous/anxious.        Objective:   Physical Exam  Constitutional: She appears well-developed. No distress.  HENT:  Head: Normocephalic.  Right Ear: External ear normal.  Left Ear: External ear normal.  Nose: Nose normal.  Mouth/Throat: Oropharynx is clear and moist.  Eyes: Conjunctivae are normal. Pupils are equal, round, and reactive to light. Right eye exhibits no discharge. Left eye exhibits no discharge.  Neck: Normal range of motion. Neck supple. No JVD present. No tracheal deviation present. No thyromegaly present.  Cardiovascular: Normal rate, regular rhythm and normal heart sounds.   Pulmonary/Chest: No stridor. No respiratory distress. She has no wheezes.  Abdominal: Soft. Bowel sounds are normal. She  exhibits no distension and no mass. There is no tenderness. There is no rebound and no guarding.  Musculoskeletal: She exhibits no edema and no tenderness.  Lymphadenopathy:    She has no cervical adenopathy.  Neurological: She displays normal reflexes. No cranial nerve deficit. She exhibits normal muscle tone. Coordination normal.  Skin: No rash noted. No erythema.  Psychiatric: Her behavior is normal. Judgment and thought content normal.       Tearful, depressed    Lab Results  Component Value Date   WBC 8.4 08/25/2010   HGB 14.4 08/25/2010   HCT 41.6 08/25/2010   PLT 202.0 08/25/2010   GLUCOSE 127* 04/26/2011   CHOL 217* 08/25/2010   TRIG 155.0* 08/25/2010   HDL 36.70* 08/25/2010   LDLDIRECT 154.9 08/25/2010   ALT 21 08/25/2010   AST 22 08/25/2010   NA 136 04/26/2011   K 3.8 04/26/2011   CL 102 04/26/2011   CREATININE 0.7 04/26/2011   BUN 23 04/26/2011   CO2 28 04/26/2011   TSH 5.47 07/07/2011   INR 0.95 11/25/2009         Assessment & Plan:

## 2011-09-24 NOTE — Assessment & Plan Note (Signed)
Worse To work 8/2 See Rx

## 2011-09-25 ENCOUNTER — Encounter: Payer: Self-pay | Admitting: Internal Medicine

## 2011-09-25 NOTE — Assessment & Plan Note (Signed)
Continue with current prescription therapy as reflected on the Med list.  

## 2011-09-25 NOTE — Assessment & Plan Note (Signed)
Continue with current prescription therapy as reflected on the Med list. Labs  

## 2011-09-25 NOTE — Assessment & Plan Note (Signed)
7/13 depression related 

## 2011-10-25 ENCOUNTER — Ambulatory Visit: Payer: PRIVATE HEALTH INSURANCE | Admitting: Internal Medicine

## 2011-10-25 DIAGNOSIS — Z0289 Encounter for other administrative examinations: Secondary | ICD-10-CM

## 2012-01-04 ENCOUNTER — Telehealth: Payer: Self-pay | Admitting: Internal Medicine

## 2012-01-04 DIAGNOSIS — R5381 Other malaise: Secondary | ICD-10-CM

## 2012-01-04 DIAGNOSIS — R5383 Other fatigue: Secondary | ICD-10-CM

## 2012-01-04 DIAGNOSIS — E039 Hypothyroidism, unspecified: Secondary | ICD-10-CM

## 2012-01-04 NOTE — Telephone Encounter (Signed)
The patient called and is hoping to get blood work done before her follow up apt schedule on Nov 7th.

## 2012-01-05 NOTE — Telephone Encounter (Signed)
Please advise what labs are needed

## 2012-01-06 NOTE — Telephone Encounter (Signed)
CMET, A1c, TSH, lipids, CBC Thx

## 2012-01-06 NOTE — Telephone Encounter (Signed)
Labs entered. Left detailed mess informing pt.  

## 2012-01-11 ENCOUNTER — Other Ambulatory Visit (INDEPENDENT_AMBULATORY_CARE_PROVIDER_SITE_OTHER): Payer: PRIVATE HEALTH INSURANCE

## 2012-01-11 DIAGNOSIS — R5383 Other fatigue: Secondary | ICD-10-CM

## 2012-01-11 DIAGNOSIS — R5381 Other malaise: Secondary | ICD-10-CM

## 2012-01-11 DIAGNOSIS — E039 Hypothyroidism, unspecified: Secondary | ICD-10-CM

## 2012-01-11 LAB — COMPREHENSIVE METABOLIC PANEL
ALT: 12 U/L (ref 0–35)
AST: 15 U/L (ref 0–37)
Alkaline Phosphatase: 63 U/L (ref 39–117)
Chloride: 104 mEq/L (ref 96–112)
Creatinine, Ser: 0.7 mg/dL (ref 0.4–1.2)
Total Bilirubin: 0.8 mg/dL (ref 0.3–1.2)

## 2012-01-11 LAB — LIPID PANEL
HDL: 36.9 mg/dL — ABNORMAL LOW (ref >39.00)
LDL Cholesterol: 133 mg/dL — ABNORMAL HIGH (ref 0–99)
Total CHOL/HDL Ratio: 5
VLDL: 29.8 mg/dL (ref 0.0–40.0)

## 2012-01-11 LAB — CBC WITH DIFFERENTIAL/PLATELET
Basophils Absolute: 0.1 10*3/uL (ref 0.0–0.1)
Basophils Relative: 1 % (ref 0.0–3.0)
HCT: 41.4 % (ref 36.0–46.0)
Hemoglobin: 13.6 g/dL (ref 12.0–15.0)
Lymphs Abs: 2.7 10*3/uL (ref 0.7–4.0)
Monocytes Relative: 5.9 % (ref 3.0–12.0)
Neutro Abs: 5.4 10*3/uL (ref 1.4–7.7)
RDW: 14.8 % — ABNORMAL HIGH (ref 11.5–14.6)

## 2012-01-11 LAB — HEMOGLOBIN A1C: Hgb A1c MFr Bld: 6.3 % (ref 4.6–6.5)

## 2012-01-13 ENCOUNTER — Ambulatory Visit (INDEPENDENT_AMBULATORY_CARE_PROVIDER_SITE_OTHER): Payer: PRIVATE HEALTH INSURANCE | Admitting: Internal Medicine

## 2012-01-13 ENCOUNTER — Encounter: Payer: Self-pay | Admitting: Internal Medicine

## 2012-01-13 VITALS — BP 106/68 | HR 76 | Temp 97.7°F | Resp 16 | Wt 195.0 lb

## 2012-01-13 DIAGNOSIS — F411 Generalized anxiety disorder: Secondary | ICD-10-CM

## 2012-01-13 DIAGNOSIS — E785 Hyperlipidemia, unspecified: Secondary | ICD-10-CM | POA: Insufficient documentation

## 2012-01-13 DIAGNOSIS — R7309 Other abnormal glucose: Secondary | ICD-10-CM

## 2012-01-13 DIAGNOSIS — E039 Hypothyroidism, unspecified: Secondary | ICD-10-CM

## 2012-01-13 DIAGNOSIS — F329 Major depressive disorder, single episode, unspecified: Secondary | ICD-10-CM

## 2012-01-13 DIAGNOSIS — R739 Hyperglycemia, unspecified: Secondary | ICD-10-CM | POA: Insufficient documentation

## 2012-01-13 MED ORDER — LEVOTHYROXINE SODIUM 137 MCG PO TABS: 137.0000 ug | ORAL_TABLET | Freq: Every day | ORAL | Status: DC

## 2012-01-13 MED ORDER — ESCITALOPRAM OXALATE 20 MG PO TABS: 20.0000 mg | ORAL_TABLET | Freq: Every day | ORAL | Status: DC

## 2012-01-13 NOTE — Assessment & Plan Note (Signed)
Wt loss Watching labs

## 2012-01-13 NOTE — Assessment & Plan Note (Signed)
Low fat diet

## 2012-01-13 NOTE — Assessment & Plan Note (Signed)
Better - will increase Lexapro

## 2012-01-13 NOTE — Progress Notes (Signed)
   Subjective:    Patient ID: Autumn Howard, female    DOB: 1960/03/16, 51 y.o.   MRN: [id number]  HPI   The patient is here to follow up on chronic depression, anxiety, headaches  C/o insomnia; grief . Better on Lexapro  Wt Readings from Last 3 Encounters:  01/13/12 195 lb (88.451 kg)  09/24/11 192 lb (87.091 kg)  04/26/11 197 lb (89.359 kg)   BP Readings from Last 3 Encounters:  01/13/12 106/68  09/24/11 138/82  04/26/11 130/90        Review of Systems  Constitutional: Positive for fatigue. Negative for chills, activity change, appetite change and unexpected weight change.  HENT: Negative for congestion, mouth sores and sinus pressure.   Eyes: Negative for visual disturbance.  Respiratory: Negative for cough and chest tightness.   Gastrointestinal: Negative for nausea and abdominal pain.  Genitourinary: Negative for frequency, difficulty urinating and vaginal pain.  Musculoskeletal: Negative for back pain and gait problem.  Skin: Negative for pallor and rash.  Neurological: Positive for headaches. Negative for dizziness, tremors, weakness and numbness.  Psychiatric/Behavioral: Negative for suicidal ideas, confusion and sleep disturbance. The patient is nervous/anxious.        Objective:   Physical Exam  Constitutional: She appears well-developed. No distress.  HENT:  Head: Normocephalic.  Right Ear: External ear normal.  Left Ear: External ear normal.  Nose: Nose normal.  Mouth/Throat: Oropharynx is clear and moist.  Eyes: Conjunctivae normal are normal. Pupils are equal, round, and reactive to light. Right eye exhibits no discharge. Left eye exhibits no discharge.  Neck: Normal range of motion. Neck supple. No JVD present. No tracheal deviation present. No thyromegaly present.  Cardiovascular: Normal rate, regular rhythm and normal heart sounds.   Pulmonary/Chest: No stridor. No respiratory distress. She has no wheezes.  Abdominal: Soft. Bowel sounds are  normal. She exhibits no distension and no mass. There is no tenderness. There is no rebound and no guarding.  Musculoskeletal: She exhibits no edema and no tenderness.  Lymphadenopathy:    She has no cervical adenopathy.  Neurological: She displays normal reflexes. No cranial nerve deficit. She exhibits normal muscle tone. Coordination normal.  Skin: No rash noted. No erythema.  Psychiatric: Her behavior is normal. Judgment and thought content normal.       Tearful, depressed    Lab Results  Component Value Date   WBC 8.8 01/11/2012   HGB 13.6 01/11/2012   HCT 41.4 01/11/2012   PLT 187.0 01/11/2012   GLUCOSE 125* 01/11/2012   CHOL 200 01/11/2012   TRIG 149.0 01/11/2012   HDL 36.90* 01/11/2012   LDLDIRECT 154.9 08/25/2010   LDLCALC 133* 01/11/2012   ALT 12 01/11/2012   AST 15 01/11/2012   NA 136 01/11/2012   K 4.5 01/11/2012   CL 104 01/11/2012   CREATININE 0.7 01/11/2012   BUN 16 01/11/2012   CO2 27 01/11/2012   TSH 7.23* 01/11/2012   INR 0.95 11/25/2009   HGBA1C 6.3 01/11/2012         Assessment & Plan:

## 2012-01-13 NOTE — Assessment & Plan Note (Signed)
Will increase Rx 

## 2012-01-13 NOTE — Assessment & Plan Note (Signed)
Continue with current prescription therapy as reflected on the Med list.  

## 2012-01-22 ENCOUNTER — Emergency Department (HOSPITAL_COMMUNITY): Payer: PRIVATE HEALTH INSURANCE

## 2012-01-22 ENCOUNTER — Encounter (HOSPITAL_COMMUNITY): Payer: Self-pay | Admitting: *Deleted

## 2012-01-22 ENCOUNTER — Emergency Department (HOSPITAL_COMMUNITY)
Admission: EM | Admit: 2012-01-22 | Discharge: 2012-01-22 | Disposition: A | Discharge status: Home / Self Care | Payer: PRIVATE HEALTH INSURANCE | Attending: Emergency Medicine | Admitting: Emergency Medicine

## 2012-01-22 DIAGNOSIS — Z79899 Other long term (current) drug therapy: Secondary | ICD-10-CM | POA: Insufficient documentation

## 2012-01-22 DIAGNOSIS — R5381 Other malaise: Secondary | ICD-10-CM | POA: Insufficient documentation

## 2012-01-22 DIAGNOSIS — F411 Generalized anxiety disorder: Secondary | ICD-10-CM | POA: Insufficient documentation

## 2012-01-22 DIAGNOSIS — R55 Syncope and collapse: Secondary | ICD-10-CM | POA: Insufficient documentation

## 2012-01-22 DIAGNOSIS — R5383 Other fatigue: Secondary | ICD-10-CM | POA: Insufficient documentation

## 2012-01-22 DIAGNOSIS — E079 Disorder of thyroid, unspecified: Secondary | ICD-10-CM | POA: Insufficient documentation

## 2012-01-22 DIAGNOSIS — F3289 Other specified depressive episodes: Secondary | ICD-10-CM | POA: Insufficient documentation

## 2012-01-22 DIAGNOSIS — Z8669 Personal history of other diseases of the nervous system and sense organs: Secondary | ICD-10-CM | POA: Insufficient documentation

## 2012-01-22 DIAGNOSIS — R0602 Shortness of breath: Secondary | ICD-10-CM | POA: Insufficient documentation

## 2012-01-22 DIAGNOSIS — R42 Dizziness and giddiness: Secondary | ICD-10-CM | POA: Insufficient documentation

## 2012-01-22 DIAGNOSIS — F329 Major depressive disorder, single episode, unspecified: Secondary | ICD-10-CM | POA: Insufficient documentation

## 2012-01-22 LAB — COMPREHENSIVE METABOLIC PANEL
BUN: 24 mg/dL — ABNORMAL HIGH (ref 6–23)
CO2: 29 mEq/L (ref 19–32)
Calcium: 9.4 mg/dL (ref 8.4–10.5)
GFR calc Af Amer: >90 mL/min (ref >90)
GFR calc non Af Amer: 82 mL/min — ABNORMAL LOW (ref >90)
Glucose, Bld: 108 mg/dL — ABNORMAL HIGH (ref 70–99)
Total Protein: 7.7 g/dL (ref 6.0–8.3)

## 2012-01-22 LAB — CBC
HCT: 41.5 % (ref 36.0–46.0)
Hemoglobin: 14.5 g/dL (ref 12.0–15.0)
MCH: 30.4 pg (ref 26.0–34.0)
MCHC: 34.9 g/dL (ref 30.0–36.0)
MCV: 87 fL (ref 78.0–100.0)

## 2012-01-22 LAB — URINALYSIS, ROUTINE W REFLEX MICROSCOPIC
Ketones, ur: NEGATIVE mg/dL
Nitrite: NEGATIVE
Specific Gravity, Urine: 1.027 (ref 1.005–1.030)
Urobilinogen, UA: 0.2 mg/dL (ref 0.0–1.0)
pH: 5 (ref 5.0–8.0)

## 2012-01-22 LAB — URINE MICROSCOPIC-ADD ON

## 2012-01-22 NOTE — ED Provider Notes (Signed)
History     CSN: [id number]  Arrival date & time 01/22/12  1406   First MD Initiated Contact with Patient 01/22/12 1726      Chief Complaint  Patient presents with  . Near Syncope    (Consider location/radiation/quality/duration/timing/severity/associated sxs/prior treatment) HPI Comments: Patient presents with a two-month history of some near syncopal episodes. She states that about 2-3 times a day for the last 2 months she's had spells where when she's walking she feels lightheaded and has the feeling that her heart is beating up and feels like she's been a black out. She states that her vision does go black momentarily and then she starts feeling better. She's never had a full syncopal episode. She denies any chest pain or shortness of breath with these episodes she denies any palpitations prior to the spells. She denies any neurologic deficits. She does have a history of migraines and has had some intermittent migraines during this time. She cannot say that her headaches have been increasing in intensity or frequency. She does feel at times short of breath during these episodes. But denies any other episodes of shortness of breath.   Past Medical History  Diagnosis Date  . Migraine headache   . Depression   . Anxiety   . Thyroid disease     History reviewed. No pertinent past surgical history.  Family History  Problem Relation Age of Onset  . Cancer Mother     lungs  . Heart disease Father   . Cancer Father     lung    History  Substance Use Topics  . Smoking status: Never Smoker   . Smokeless tobacco: Not on file  . Alcohol Use: No    OB History    Grav Para Term Preterm Abortions TAB SAB Ect Mult Living                  Review of Systems  Constitutional: Negative for fever, chills, diaphoresis and fatigue.  HENT: Negative for congestion, rhinorrhea and sneezing.   Eyes: Negative.   Respiratory: Positive for shortness of breath. Negative for cough and chest  tightness.   Cardiovascular: Negative for chest pain and leg swelling.  Gastrointestinal: Negative for nausea, vomiting, abdominal pain, diarrhea and blood in stool.  Genitourinary: Negative for frequency, hematuria, flank pain and difficulty urinating.  Musculoskeletal: Negative for back pain and arthralgias.  Skin: Negative for rash.  Neurological: Positive for dizziness and weakness. Negative for speech difficulty, numbness and headaches.    Allergies  Imitrex and Maxalt  Home Medications   Current Outpatient Rx  Name  Route  Sig  Dispense  Refill  . CALCIUM PO   Oral   Take 1 tablet by mouth every evening.         Marland Kitchen ESCITALOPRAM OXALATE 20 MG PO TABS   Oral   Take 20 mg by mouth daily.         Marland Kitchen LEVOTHYROXINE SODIUM 125 MCG PO TABS   Oral   Take 125 mcg by mouth daily.         Marland Kitchen POLYETHYLENE GLYCOL 3350 PO PACK   Oral   Take 17 g by mouth daily as needed. For constipation         . PROTRIPTYLINE HCL 10 MG PO TABS   Oral   Take 40 mg by mouth at bedtime.         . SENNA-DOCUSATE SODIUM 8.6-50 MG PO TABS   Oral   Take 2  tablets by mouth 2 (two) times daily.         Marland Kitchen VITAMIN D 1000 UNITS PO TABS   Oral   Take 1,000 Units by mouth daily.           BP 119/79  Pulse 92  Temp 98.7 F (37.1 C) (Oral)  Resp 16  SpO2 100%  LMP 12/22/2011  Physical Exam  Constitutional: She is oriented to person, place, and time. She appears well-developed and well-nourished.  HENT:  Head: Normocephalic and atraumatic.  Eyes: Pupils are equal, round, and reactive to light.  Neck: Normal range of motion. Neck supple.  Cardiovascular: Normal rate, regular rhythm and normal heart sounds.   Pulmonary/Chest: Effort normal and breath sounds normal. No respiratory distress. She has no wheezes. She has no rales. She exhibits no tenderness.  Abdominal: Soft. Bowel sounds are normal. There is no tenderness. There is no rebound and no guarding.  Musculoskeletal: Normal  range of motion. She exhibits no edema.  Lymphadenopathy:    She has no cervical adenopathy.  Neurological: She is alert and oriented to person, place, and time. She has normal strength. No cranial nerve deficit or sensory deficit. GCS eye subscore is 4. GCS verbal subscore is 5. GCS motor subscore is 6.       Finger to nose intact  Skin: Skin is warm and dry. No rash noted.  Psychiatric: She has a normal mood and affect.    ED Course  Procedures (including critical care time)  Results for orders placed during the hospital encounter of 01/22/12  CBC      Component Value Range   WBC 10.3  4.0 - 10.5 K/uL   RBC 4.77  3.87 - 5.11 MIL/uL   Hemoglobin 14.5  12.0 - 15.0 g/dL   HCT 16.1  09.6 - 04.5 %   MCV 87.0  78.0 - 100.0 fL   MCH 30.4  26.0 - 34.0 pg   MCHC 34.9  30.0 - 36.0 g/dL   RDW 40.9  81.1 - 91.4 %   Platelets 214  150 - 400 K/uL  COMPREHENSIVE METABOLIC PANEL      Component Value Range   Sodium 137  135 - 145 mEq/L   Potassium 3.6  3.5 - 5.1 mEq/L   Chloride 100  96 - 112 mEq/L   CO2 29  19 - 32 mEq/L   Glucose, Bld 108 (*) 70 - 99 mg/dL   BUN 24 (*) 6 - 23 mg/dL   Creatinine, Ser 7.82  0.50 - 1.10 mg/dL   Calcium 9.4  8.4 - 95.6 mg/dL   Total Protein 7.7  6.0 - 8.3 g/dL   Albumin 4.0  3.5 - 5.2 g/dL   AST 14  0 - 37 U/L   ALT 12  0 - 35 U/L   Alkaline Phosphatase 79  39 - 117 U/L   Total Bilirubin 0.5  0.3 - 1.2 mg/dL   GFR calc non Af Amer 82 (*) >90 mL/min   GFR calc Af Amer >90  >90 mL/min  URINALYSIS, ROUTINE W REFLEX MICROSCOPIC      Component Value Range   Color, Urine YELLOW  YELLOW   APPearance CLOUDY (*) CLEAR   Specific Gravity, Urine 1.027  1.005 - 1.030   pH 5.0  5.0 - 8.0   Glucose, UA NEGATIVE  NEGATIVE mg/dL   Hgb urine dipstick NEGATIVE  NEGATIVE   Bilirubin Urine NEGATIVE  NEGATIVE   Ketones, ur NEGATIVE  NEGATIVE mg/dL  Protein, ur NEGATIVE  NEGATIVE mg/dL   Urobilinogen, UA 0.2  0.0 - 1.0 mg/dL   Nitrite NEGATIVE  NEGATIVE    Leukocytes, UA MODERATE (*) NEGATIVE  PREGNANCY, URINE      Component Value Range   Preg Test, Ur NEGATIVE  NEGATIVE  URINE MICROSCOPIC-ADD ON      Component Value Range   Squamous Epithelial / LPF FEW (*) RARE   WBC, UA 3-6  <3 WBC/hpf   Bacteria, UA FEW (*) RARE   Ct Head Wo Contrast  01/22/2012  *RADIOLOGY REPORT*  Clinical Data:  Multiple syncopal episodes recently.  History of migraine headaches.  CT HEAD WITHOUT CONTRAST  Technique:  Contiguous axial images were obtained from the base of the skull through the vertex without contrast  Comparison:  None.  Findings:  The brain has a normal appearance without evidence for hemorrhage, acute infarction, hydrocephalus, or mass lesion.  There is no extra axial fluid collection.  The skull and paranasal sinuses are normal.  IMPRESSION: Normal CT of the head without contrast.   Original Report Authenticated By: Davonna Belling, M.D.       Date: 01/22/2012  Rate: 82  Rhythm: normal sinus rhythm  QRS Axis: normal  Intervals: normal  ST/T Wave abnormalities: normal  Conduction Disutrbances:none  Narrative Interpretation:   Old EKG Reviewed: none available   1. Near syncope       MDM  Patient is not orthostatic. There's nothing to suggest acute coronary syndrome. She has no hypoxia, tachycardia, or other symptoms to suggest pulmonary embolus. She has no evidence of arrhythmia. Her labs look okay. The symptoms have been ongoing for approximately 2 months. I feel that she safe for discharge however identifies her to report and to followup with her primary care physician for possible further testing.        Rolan Bucco, MD 01/22/12 [phone]

## 2012-01-22 NOTE — ED Notes (Addendum)
Patient with history of migraine and on Thursday she "blacked out."  Patient states that when she gets up and starts walking, she "blacks out." It has been happening frequently.

## 2012-01-22 NOTE — ED Notes (Signed)
Patient transported to CT 

## 2012-01-22 NOTE — ED Notes (Signed)
Back from CT

## 2012-01-24 ENCOUNTER — Ambulatory Visit (INDEPENDENT_AMBULATORY_CARE_PROVIDER_SITE_OTHER): Payer: PRIVATE HEALTH INSURANCE | Admitting: Internal Medicine

## 2012-01-24 ENCOUNTER — Encounter: Payer: Self-pay | Admitting: Internal Medicine

## 2012-01-24 VITALS — BP 118/80 | HR 80 | Temp 97.9°F | Resp 16 | Wt 196.0 lb

## 2012-01-24 DIAGNOSIS — F329 Major depressive disorder, single episode, unspecified: Secondary | ICD-10-CM

## 2012-01-24 DIAGNOSIS — R55 Syncope and collapse: Secondary | ICD-10-CM | POA: Insufficient documentation

## 2012-01-24 DIAGNOSIS — F411 Generalized anxiety disorder: Secondary | ICD-10-CM

## 2012-01-24 DIAGNOSIS — E039 Hypothyroidism, unspecified: Secondary | ICD-10-CM

## 2012-01-24 LAB — URINE CULTURE

## 2012-01-24 MED ORDER — CIPROFLOXACIN HCL 250 MG PO TABS: 250.0000 mg | ORAL_TABLET | Freq: Two times a day (BID) | ORAL | Status: DC

## 2012-01-24 MED ORDER — LEVOTHYROXINE SODIUM 137 MCG PO CAPS: 1.0000 | ORAL_CAPSULE | Freq: Every morning | ORAL | Status: DC

## 2012-01-24 NOTE — Progress Notes (Signed)
   Subjective:    Patient ID: Autumn Howard, female    DOB: 02-25-1961, 51 y.o.   MRN: [id number]  HPI  C/o orthostatic near syncope x long time (x 2 mo). She past out last Thursday. She almost passed out on Sat - went to ER - not orthostatic; nl CT.  The patient is here to follow up on chronic depression, anxiety, headaches  C/o insomnia; grief . Better on Lexapro  Wt Readings from Last 3 Encounters:  01/24/12 196 lb (88.905 kg)  01/13/12 195 lb (88.451 kg)  09/24/11 192 lb (87.091 kg)   BP Readings from Last 3 Encounters:  01/24/12 118/80  01/22/12 123/79  01/13/12 106/68        Review of Systems  Constitutional: Positive for fatigue. Negative for chills, activity change, appetite change and unexpected weight change.  HENT: Negative for congestion, mouth sores and sinus pressure.   Eyes: Negative for visual disturbance.  Respiratory: Negative for cough and chest tightness.   Gastrointestinal: Negative for nausea and abdominal pain.  Genitourinary: Negative for frequency, difficulty urinating and vaginal pain.  Musculoskeletal: Negative for back pain and gait problem.  Skin: Negative for pallor and rash.  Neurological: Positive for headaches. Negative for dizziness, tremors, weakness and numbness.  Psychiatric/Behavioral: Negative for suicidal ideas, confusion and sleep disturbance. The patient is nervous/anxious.        Objective:   Physical Exam  Constitutional: She appears well-developed. No distress.  HENT:  Head: Normocephalic.  Right Ear: External ear normal.  Left Ear: External ear normal.  Nose: Nose normal.  Mouth/Throat: Oropharynx is clear and moist.  Eyes: Conjunctivae normal are normal. Pupils are equal, round, and reactive to light. Right eye exhibits no discharge. Left eye exhibits no discharge.  Neck: Normal range of motion. Neck supple. No JVD present. No tracheal deviation present. No thyromegaly present.  Cardiovascular: Normal rate, regular  rhythm and normal heart sounds.   Pulmonary/Chest: No stridor. No respiratory distress. She has no wheezes.  Abdominal: Soft. Bowel sounds are normal. She exhibits no distension and no mass. There is no tenderness. There is no rebound and no guarding.  Musculoskeletal: She exhibits no edema and no tenderness.  Lymphadenopathy:    She has no cervical adenopathy.  Neurological: She displays normal reflexes. No cranial nerve deficit. She exhibits normal muscle tone. Coordination normal.  Skin: No rash noted. No erythema.  Psychiatric: Her behavior is normal. Judgment and thought content normal.       Tearful, depressed    Lab Results  Component Value Date   WBC 10.3 01/22/2012   HGB 14.5 01/22/2012   HCT 41.5 01/22/2012   PLT 214 01/22/2012   GLUCOSE 108* 01/22/2012   CHOL 200 01/11/2012   TRIG 149.0 01/11/2012   HDL 36.90* 01/11/2012   LDLDIRECT 154.9 08/25/2010   LDLCALC 133* 01/11/2012   ALT 12 01/22/2012   AST 14 01/22/2012   NA 137 01/22/2012   K 3.6 01/22/2012   CL 100 01/22/2012   CREATININE 0.81 01/22/2012   BUN 24* 01/22/2012   CO2 29 01/22/2012   TSH 7.23* 01/11/2012   INR 0.95 11/25/2009   HGBA1C 6.3 01/11/2012         Assessment & Plan:

## 2012-01-24 NOTE — Assessment & Plan Note (Signed)
Continue with current prescription therapy as reflected on the Med list.  

## 2012-01-24 NOTE — Assessment & Plan Note (Signed)
11/13 poss due to Vivactil Will taper off and d/c Vivactil Card cons if not better

## 2012-01-24 NOTE — Assessment & Plan Note (Signed)
We increased Levothyroxine to 137 mcg/d

## 2012-01-25 NOTE — Assessment & Plan Note (Signed)
Continue with current prescription therapy as reflected on the Med list.  

## 2012-04-11 ENCOUNTER — Telehealth: Payer: Self-pay | Admitting: Internal Medicine

## 2012-04-11 ENCOUNTER — Other Ambulatory Visit (INDEPENDENT_AMBULATORY_CARE_PROVIDER_SITE_OTHER): Payer: PRIVATE HEALTH INSURANCE

## 2012-04-11 DIAGNOSIS — F3289 Other specified depressive episodes: Secondary | ICD-10-CM

## 2012-04-11 DIAGNOSIS — F411 Generalized anxiety disorder: Secondary | ICD-10-CM

## 2012-04-11 DIAGNOSIS — R739 Hyperglycemia, unspecified: Secondary | ICD-10-CM

## 2012-04-11 DIAGNOSIS — F329 Major depressive disorder, single episode, unspecified: Secondary | ICD-10-CM

## 2012-04-11 DIAGNOSIS — R7309 Other abnormal glucose: Secondary | ICD-10-CM

## 2012-04-11 DIAGNOSIS — E785 Hyperlipidemia, unspecified: Secondary | ICD-10-CM

## 2012-04-11 DIAGNOSIS — E039 Hypothyroidism, unspecified: Secondary | ICD-10-CM

## 2012-04-11 LAB — BASIC METABOLIC PANEL
CO2: 27 mEq/L (ref 19–32)
Calcium: 9.4 mg/dL (ref 8.4–10.5)
Creatinine, Ser: 0.8 mg/dL (ref 0.4–1.2)
GFR: 79.92 mL/min (ref >60.00)
Sodium: 136 mEq/L (ref 135–145)

## 2012-04-11 NOTE — Telephone Encounter (Signed)
We can do it later Pls ask Belisa to check her CBG once in a while  Thx

## 2012-04-11 NOTE — Telephone Encounter (Signed)
No, a EDTA tube wasn't drawn. Do you want pt to come back for redraw?

## 2012-04-11 NOTE — Telephone Encounter (Signed)
Autumn Howard, Can lab run A1c off the specimen? Thx

## 2012-04-13 ENCOUNTER — Ambulatory Visit (INDEPENDENT_AMBULATORY_CARE_PROVIDER_SITE_OTHER): Payer: PRIVATE HEALTH INSURANCE | Admitting: Internal Medicine

## 2012-04-13 ENCOUNTER — Encounter: Payer: Self-pay | Admitting: Internal Medicine

## 2012-04-13 VITALS — BP 110/90 | HR 80 | Temp 97.0°F | Resp 16 | Wt 194.0 lb

## 2012-04-13 DIAGNOSIS — R55 Syncope and collapse: Secondary | ICD-10-CM

## 2012-04-13 DIAGNOSIS — E039 Hypothyroidism, unspecified: Secondary | ICD-10-CM

## 2012-04-13 DIAGNOSIS — R7309 Other abnormal glucose: Secondary | ICD-10-CM

## 2012-04-13 DIAGNOSIS — R739 Hyperglycemia, unspecified: Secondary | ICD-10-CM

## 2012-04-13 DIAGNOSIS — F411 Generalized anxiety disorder: Secondary | ICD-10-CM

## 2012-04-13 DIAGNOSIS — F3289 Other specified depressive episodes: Secondary | ICD-10-CM

## 2012-04-13 DIAGNOSIS — G43909 Migraine, unspecified, not intractable, without status migrainosus: Secondary | ICD-10-CM

## 2012-04-13 DIAGNOSIS — F329 Major depressive disorder, single episode, unspecified: Secondary | ICD-10-CM

## 2012-04-13 MED ORDER — OXYCODONE-ACETAMINOPHEN 10-325 MG PO TABS: 1.0000 | ORAL_TABLET | Freq: Three times a day (TID) | ORAL | Status: AC

## 2012-04-13 MED ORDER — BUTALBITAL-ACETAMINOPHEN 50-300 MG PO TABS: 1.0000 | ORAL_TABLET | Freq: Two times a day (BID) | ORAL | Status: DC | PRN

## 2012-04-13 MED ORDER — TOPIRAMATE 50 MG PO TABS: 50.0000 mg | ORAL_TABLET | Freq: Two times a day (BID) | ORAL | Status: DC

## 2012-04-13 NOTE — Progress Notes (Signed)
   Subjective:     HPI  F/u orthostatic near syncope x long time - no relapse off vivactil  C/o HAs 2-3 per week - bad  The patient is here to follow up on chronic depression -  better, anxiety, headaches  C/o insomnia; grief . Better on Lexapro  Wt Readings from Last 3 Encounters:  04/13/12 194 lb (87.998 kg)  01/24/12 196 lb (88.905 kg)  01/13/12 195 lb (88.451 kg)   BP Readings from Last 3 Encounters:  04/13/12 110/90  01/24/12 118/80  01/22/12 123/79        Review of Systems  Constitutional: Positive for fatigue. Negative for chills, activity change, appetite change and unexpected weight change.  HENT: Negative for congestion, mouth sores and sinus pressure.   Eyes: Negative for visual disturbance.  Respiratory: Negative for cough and chest tightness.   Gastrointestinal: Negative for nausea and abdominal pain.  Genitourinary: Negative for frequency, difficulty urinating and vaginal pain.  Musculoskeletal: Negative for back pain and gait problem.  Skin: Negative for pallor and rash.  Neurological: Positive for headaches. Negative for dizziness, tremors, weakness and numbness.  Psychiatric/Behavioral: Negative for suicidal ideas, confusion and sleep disturbance. The patient is nervous/anxious.        Objective:   Physical Exam  Constitutional: She appears well-developed. No distress.  HENT:  Head: Normocephalic.  Right Ear: External ear normal.  Left Ear: External ear normal.  Nose: Nose normal.  Mouth/Throat: Oropharynx is clear and moist.  Eyes: Conjunctivae normal are normal. Pupils are equal, round, and reactive to light. Right eye exhibits no discharge. Left eye exhibits no discharge.  Neck: Normal range of motion. Neck supple. No JVD present. No tracheal deviation present. No thyromegaly present.  Cardiovascular: Normal rate, regular rhythm and normal heart sounds.   Pulmonary/Chest: No stridor. No respiratory distress. She has no wheezes.  Abdominal:  Soft. Bowel sounds are normal. She exhibits no distension and no mass. There is no tenderness. There is no rebound and no guarding.  Musculoskeletal: She exhibits no edema and no tenderness.  Lymphadenopathy:    She has no cervical adenopathy.  Neurological: She displays normal reflexes. No cranial nerve deficit. She exhibits normal muscle tone. Coordination normal.  Skin: No rash noted. No erythema.  Psychiatric: Her behavior is normal. Judgment and thought content normal.       Tearful, depressed    Lab Results  Component Value Date   WBC 10.3 01/22/2012   HGB 14.5 01/22/2012   HCT 41.5 01/22/2012   PLT 214 01/22/2012   GLUCOSE 143* 04/11/2012   CHOL 200 01/11/2012   TRIG 149.0 01/11/2012   HDL 36.90* 01/11/2012   LDLDIRECT 154.9 08/25/2010   LDLCALC 133* 01/11/2012   ALT 12 01/22/2012   AST 14 01/22/2012   NA 136 04/11/2012   K 4.0 04/11/2012   CL 100 04/11/2012   CREATININE 0.8 04/11/2012   BUN 25* 04/11/2012   CO2 27 04/11/2012   TSH 0.64 04/11/2012   INR 0.95 11/25/2009   HGBA1C 6.3 01/11/2012         Assessment & Plan:

## 2012-04-13 NOTE — Assessment & Plan Note (Signed)
Continue with current prescription therapy as reflected on the Med list.  

## 2012-04-13 NOTE — Assessment & Plan Note (Signed)
no relapse 

## 2012-04-13 NOTE — Telephone Encounter (Signed)
Pt informed

## 2012-04-13 NOTE — Assessment & Plan Note (Signed)
Diet Labs in 3 mo

## 2012-04-13 NOTE — Assessment & Plan Note (Signed)
Better - cont Rx 

## 2012-04-13 NOTE — Assessment & Plan Note (Signed)
Start Topamax

## 2012-06-14 ENCOUNTER — Ambulatory Visit (INDEPENDENT_AMBULATORY_CARE_PROVIDER_SITE_OTHER): Payer: PRIVATE HEALTH INSURANCE | Admitting: Internal Medicine

## 2012-06-14 ENCOUNTER — Other Ambulatory Visit (INDEPENDENT_AMBULATORY_CARE_PROVIDER_SITE_OTHER): Payer: PRIVATE HEALTH INSURANCE

## 2012-06-14 ENCOUNTER — Encounter: Payer: Self-pay | Admitting: Internal Medicine

## 2012-06-14 VITALS — BP 108/84 | HR 76 | Temp 97.8°F | Resp 16 | Wt 190.0 lb

## 2012-06-14 DIAGNOSIS — R7309 Other abnormal glucose: Secondary | ICD-10-CM

## 2012-06-14 DIAGNOSIS — R55 Syncope and collapse: Secondary | ICD-10-CM

## 2012-06-14 DIAGNOSIS — R110 Nausea: Secondary | ICD-10-CM

## 2012-06-14 DIAGNOSIS — F411 Generalized anxiety disorder: Secondary | ICD-10-CM

## 2012-06-14 DIAGNOSIS — R739 Hyperglycemia, unspecified: Secondary | ICD-10-CM

## 2012-06-14 DIAGNOSIS — E039 Hypothyroidism, unspecified: Secondary | ICD-10-CM

## 2012-06-14 DIAGNOSIS — F329 Major depressive disorder, single episode, unspecified: Secondary | ICD-10-CM

## 2012-06-14 DIAGNOSIS — G43909 Migraine, unspecified, not intractable, without status migrainosus: Secondary | ICD-10-CM

## 2012-06-14 LAB — BASIC METABOLIC PANEL
BUN: 21 mg/dL (ref 6–23)
GFR: 76.54 mL/min (ref >60.00)
Potassium: 4 mEq/L (ref 3.5–5.1)

## 2012-06-14 LAB — HEMOGLOBIN A1C: Hgb A1c MFr Bld: 6.2 % (ref 4.6–6.5)

## 2012-06-14 LAB — TSH: TSH: 1.8 u[IU]/mL (ref 0.35–5.50)

## 2012-06-14 MED ORDER — PREDNISONE 10 MG PO TABS: ORAL_TABLET | ORAL | Status: DC

## 2012-06-14 MED ORDER — ONDANSETRON HCL 4 MG/2ML IJ SOLN
4.0000 mg | Freq: Once | INTRAMUSCULAR | Status: AC
  Administered 2012-06-14: 4 mg via INTRAVENOUS

## 2012-06-14 MED ORDER — ONDANSETRON HCL 4 MG PO TABS: 4.0000 mg | ORAL_TABLET | Freq: Three times a day (TID) | ORAL | Status: DC | PRN

## 2012-06-14 MED ORDER — KETOROLAC TROMETHAMINE 10 MG PO TABS: 10.0000 mg | ORAL_TABLET | Freq: Four times a day (QID) | ORAL | Status: DC | PRN

## 2012-06-14 MED ORDER — KETOROLAC TROMETHAMINE 60 MG/2ML IM SOLN
60.0000 mg | Freq: Once | INTRAMUSCULAR | Status: AC
  Administered 2012-06-14: 60 mg via INTRAMUSCULAR

## 2012-06-14 NOTE — Progress Notes (Signed)
Patient ID: Autumn Howard, female   DOB: 28-Mar-1960, 52 y.o.   MRN: [id number]

## 2012-06-14 NOTE — Assessment & Plan Note (Signed)
IM Toradol and IM Zofran given Rx: po Toradol and po Zofran prn

## 2012-06-14 NOTE — Assessment & Plan Note (Signed)
Labs

## 2012-07-18 DIAGNOSIS — Z0279 Encounter for issue of other medical certificate: Secondary | ICD-10-CM

## 2012-09-01 ENCOUNTER — Telehealth: Payer: Self-pay

## 2012-09-01 MED ORDER — ESCITALOPRAM OXALATE 20 MG PO TABS: ORAL_TABLET | ORAL | Status: DC

## 2012-09-01 NOTE — Telephone Encounter (Signed)
Phone call from patient requesting a refill on Lexapro [facility] outpatient pharmacy) but she is also requesting an increase from the 20 mg she is already on. She states she going through a lot right now and is not doing so good. Please advise.

## 2012-09-01 NOTE — Telephone Encounter (Signed)
Ok for increase lexapro 20 mg to 1.5 tabs per day , but needs to watch for any sleepiness/sedation - done erx

## 2012-09-01 NOTE — Telephone Encounter (Signed)
Called the patient informed of MD instructions on increase in medication.

## 2012-10-17 ENCOUNTER — Other Ambulatory Visit: Payer: Self-pay

## 2012-10-17 DIAGNOSIS — Z1231 Encounter for screening mammogram for malignant neoplasm of breast: Secondary | ICD-10-CM

## 2012-10-27 ENCOUNTER — Ambulatory Visit
Admission: RE | Admit: 2012-10-27 | Discharge: 2012-10-27 | Disposition: A | Discharge status: Home / Self Care | Payer: PRIVATE HEALTH INSURANCE | Source: Ambulatory Visit

## 2012-10-27 DIAGNOSIS — Z1231 Encounter for screening mammogram for malignant neoplasm of breast: Secondary | ICD-10-CM

## 2013-01-30 ENCOUNTER — Ambulatory Visit (INDEPENDENT_AMBULATORY_CARE_PROVIDER_SITE_OTHER)
Admission: RE | Admit: 2013-01-30 | Discharge: 2013-01-30 | Disposition: A | Discharge status: Home / Self Care | Payer: PRIVATE HEALTH INSURANCE | Source: Ambulatory Visit | Attending: Internal Medicine | Admitting: Internal Medicine

## 2013-01-30 ENCOUNTER — Other Ambulatory Visit (INDEPENDENT_AMBULATORY_CARE_PROVIDER_SITE_OTHER): Payer: PRIVATE HEALTH INSURANCE

## 2013-01-30 ENCOUNTER — Encounter: Payer: Self-pay | Admitting: Internal Medicine

## 2013-01-30 ENCOUNTER — Ambulatory Visit (INDEPENDENT_AMBULATORY_CARE_PROVIDER_SITE_OTHER): Payer: PRIVATE HEALTH INSURANCE | Admitting: Internal Medicine

## 2013-01-30 VITALS — BP 120/90 | HR 72 | Temp 98.2°F | Resp 16 | Wt 194.0 lb

## 2013-01-30 DIAGNOSIS — L661 Lichen planopilaris, unspecified: Secondary | ICD-10-CM

## 2013-01-30 DIAGNOSIS — R739 Hyperglycemia, unspecified: Secondary | ICD-10-CM

## 2013-01-30 DIAGNOSIS — R5383 Other fatigue: Secondary | ICD-10-CM | POA: Insufficient documentation

## 2013-01-30 DIAGNOSIS — F329 Major depressive disorder, single episode, unspecified: Secondary | ICD-10-CM

## 2013-01-30 DIAGNOSIS — F3289 Other specified depressive episodes: Secondary | ICD-10-CM

## 2013-01-30 DIAGNOSIS — R220 Localized swelling, mass and lump, head: Secondary | ICD-10-CM

## 2013-01-30 DIAGNOSIS — L439 Lichen planus, unspecified: Secondary | ICD-10-CM

## 2013-01-30 DIAGNOSIS — R222 Localized swelling, mass and lump, trunk: Secondary | ICD-10-CM

## 2013-01-30 DIAGNOSIS — G43909 Migraine, unspecified, not intractable, without status migrainosus: Secondary | ICD-10-CM

## 2013-01-30 DIAGNOSIS — E049 Nontoxic goiter, unspecified: Secondary | ICD-10-CM

## 2013-01-30 DIAGNOSIS — E039 Hypothyroidism, unspecified: Secondary | ICD-10-CM

## 2013-01-30 DIAGNOSIS — R7309 Other abnormal glucose: Secondary | ICD-10-CM

## 2013-01-30 DIAGNOSIS — F411 Generalized anxiety disorder: Secondary | ICD-10-CM

## 2013-01-30 DIAGNOSIS — R5381 Other malaise: Secondary | ICD-10-CM

## 2013-01-30 DIAGNOSIS — E010 Iodine-deficiency related diffuse (endemic) goiter: Secondary | ICD-10-CM

## 2013-01-30 LAB — URINALYSIS
Hgb urine dipstick: NEGATIVE
Nitrite: NEGATIVE
Specific Gravity, Urine: >=1.03 (ref 1.000–1.030)
Urobilinogen, UA: 0.2 (ref 0.0–1.0)
pH: 6 (ref 5.0–8.0)

## 2013-01-30 LAB — CBC WITH DIFFERENTIAL/PLATELET
Basophils Absolute: 0.1 10*3/uL (ref 0.0–0.1)
Basophils Relative: 1.2 % (ref 0.0–3.0)
Eosinophils Absolute: 0.2 10*3/uL (ref 0.0–0.7)
Hemoglobin: 14.9 g/dL (ref 12.0–15.0)
Lymphocytes Relative: 34.8 % (ref 12.0–46.0)
MCHC: 33.8 g/dL (ref 30.0–36.0)
Monocytes Relative: 6.3 % (ref 3.0–12.0)
Neutro Abs: 4.1 10*3/uL (ref 1.4–7.7)
Neutrophils Relative %: 54.6 % (ref 43.0–77.0)
Platelets: 191 10*3/uL (ref 150.0–400.0)
RBC: 4.91 Mil/uL (ref 3.87–5.11)
WBC: 7.5 10*3/uL (ref 4.5–10.5)

## 2013-01-30 LAB — BASIC METABOLIC PANEL
CO2: 29 mEq/L (ref 19–32)
Calcium: 8.8 mg/dL (ref 8.4–10.5)
Creatinine, Ser: 1 mg/dL (ref 0.4–1.2)
GFR: 65.34 mL/min (ref >60.00)

## 2013-01-30 LAB — VITAMIN B12: Vitamin B-12: 222 pg/mL (ref 211–911)

## 2013-01-30 LAB — HEPATIC FUNCTION PANEL
ALT: 16 U/L (ref 0–35)
Albumin: 4.2 g/dL (ref 3.5–5.2)
Bilirubin, Direct: 0.1 mg/dL (ref 0.0–0.3)

## 2013-01-30 LAB — T3, FREE: T3, Free: 2 pg/mL — ABNORMAL LOW (ref 2.3–4.2)

## 2013-01-30 LAB — SEDIMENTATION RATE: Sed Rate: 9 mm/hr (ref 0–22)

## 2013-01-30 MED ORDER — THYROID 120 MG PO TABS: 120.0000 mg | ORAL_TABLET | Freq: Every day | ORAL | Status: DC

## 2013-01-30 MED ORDER — TOPIRAMATE 100 MG PO TABS: 100.0000 mg | ORAL_TABLET | Freq: Two times a day (BID) | ORAL | Status: DC

## 2013-01-30 NOTE — Progress Notes (Signed)
Pre visit review using our clinic review tool, if applicable. No additional management support is needed unless otherwise documented below in the visit note. 

## 2013-01-30 NOTE — Assessment & Plan Note (Signed)
7/13 depression related

## 2013-01-30 NOTE — Assessment & Plan Note (Signed)
11/14 s/p skin bx on scalp In Derm study

## 2013-01-30 NOTE — Assessment & Plan Note (Signed)
R medial 11/14 new  Likely OA X ray

## 2013-01-30 NOTE — Progress Notes (Signed)
   Subjective:     HPI  F/u orthostatic near syncope x long time - no relapse off vivactil   F/u HAs 2-3 per week - bad  The patient is here to follow up on chronic depression -  better, anxiety, headaches  F/u insomnia; grief. Better on Lexapro  Wt Readings from Last 3 Encounters:  01/30/13 194 lb (87.998 kg)  06/14/12 190 lb (86.183 kg)  04/13/12 194 lb (87.998 kg)   BP Readings from Last 3 Encounters:  01/30/13 120/90  06/14/12 108/84  04/13/12 110/90        Review of Systems  Constitutional: Positive for fatigue. Negative for chills, activity change, appetite change and unexpected weight change.  HENT: Negative for congestion, mouth sores and sinus pressure.   Eyes: Negative for visual disturbance.  Respiratory: Negative for cough and chest tightness.   Gastrointestinal: Negative for nausea and abdominal pain.  Genitourinary: Negative for frequency, difficulty urinating and vaginal pain.  Musculoskeletal: Negative for back pain and gait problem.  Skin: Negative for pallor and rash.  Neurological: Positive for headaches. Negative for dizziness, tremors, weakness and numbness.  Psychiatric/Behavioral: Negative for suicidal ideas, confusion and sleep disturbance. The patient is nervous/anxious.        Objective:   Physical Exam  Constitutional: She appears well-developed. No distress.  HENT:  Head: Normocephalic.  Right Ear: External ear normal.  Left Ear: External ear normal.  Nose: Nose normal.  Mouth/Throat: Oropharynx is clear and moist.  Eyes: Conjunctivae are normal. Pupils are equal, round, and reactive to light. Right eye exhibits no discharge. Left eye exhibits no discharge.  Neck: Normal range of motion. Neck supple. No JVD present. No tracheal deviation present. No thyromegaly present.  Cardiovascular: Normal rate, regular rhythm and normal heart sounds.   Pulmonary/Chest: No stridor. No respiratory distress. She has no wheezes.  Abdominal: Soft.  Bowel sounds are normal. She exhibits no distension and no mass. There is no tenderness. There is no rebound and no guarding.  Musculoskeletal: She exhibits no edema and no tenderness.  Lymphadenopathy:    She has no cervical adenopathy.  Neurological: She displays normal reflexes. No cranial nerve deficit. She exhibits normal muscle tone. Coordination normal.  Skin: No rash noted. No erythema.  Psychiatric: Her behavior is normal. Judgment and thought content normal.  Tearful, depressed    Lab Results  Component Value Date   WBC 10.3 01/22/2012   HGB 14.5 01/22/2012   HCT 41.5 01/22/2012   PLT 214 01/22/2012   GLUCOSE 135* 06/14/2012   CHOL 200 01/11/2012   TRIG 149.0 01/11/2012   HDL 36.90* 01/11/2012   LDLDIRECT 154.9 08/25/2010   LDLCALC 133* 01/11/2012   ALT 12 01/22/2012   AST 14 01/22/2012   NA 136 06/14/2012   K 4.0 06/14/2012   CL 105 06/14/2012   CREATININE 0.8 06/14/2012   BUN 21 06/14/2012   CO2 26 06/14/2012   TSH 1.80 06/14/2012   INR 0.95 11/25/2009   HGBA1C 6.2 06/14/2012         Assessment & Plan:

## 2013-01-30 NOTE — Assessment & Plan Note (Signed)
Continue with current prescription therapy as reflected on the Med list.  

## 2013-01-30 NOTE — Assessment & Plan Note (Signed)
She wants to try Armour

## 2013-01-30 NOTE — Patient Instructions (Signed)
   Milk free trial (no milk, ice cream, cheese and yogurt) for 4-6 weeks. OK to use almond, coconut, rice or soy milk. "Almond breeze" brand tastes good.  

## 2013-01-30 NOTE — Assessment & Plan Note (Signed)
US

## 2013-01-30 NOTE — Assessment & Plan Note (Signed)
labs

## 2013-02-01 ENCOUNTER — Other Ambulatory Visit: Payer: Self-pay | Admitting: Internal Medicine

## 2013-02-01 DIAGNOSIS — E010 Iodine-deficiency related diffuse (endemic) goiter: Secondary | ICD-10-CM

## 2013-02-01 DIAGNOSIS — F329 Major depressive disorder, single episode, unspecified: Secondary | ICD-10-CM

## 2013-02-01 DIAGNOSIS — R739 Hyperglycemia, unspecified: Secondary | ICD-10-CM

## 2013-02-01 DIAGNOSIS — L661 Lichen planopilaris: Secondary | ICD-10-CM

## 2013-02-01 DIAGNOSIS — E039 Hypothyroidism, unspecified: Secondary | ICD-10-CM

## 2013-02-01 DIAGNOSIS — E538 Deficiency of other specified B group vitamins: Secondary | ICD-10-CM | POA: Insufficient documentation

## 2013-02-01 DIAGNOSIS — R5383 Other fatigue: Secondary | ICD-10-CM

## 2013-02-01 MED ORDER — VITAMIN B-12 1000 MCG SL SUBL: 1.0000 | SUBLINGUAL_TABLET | Freq: Every day | SUBLINGUAL | Status: DC

## 2013-02-01 NOTE — Assessment & Plan Note (Signed)
Labs Will switch to Armour per pt's request

## 2013-02-01 NOTE — Assessment & Plan Note (Signed)
Start SL Vit B12

## 2013-02-01 NOTE — Assessment & Plan Note (Signed)
Diet, wt loss 

## 2013-02-01 NOTE — Progress Notes (Signed)
   Subjective:     HPI C/o fatigue, hair loss, neck swelling at the bottom and R collar bone swelling  F/u orthostatic near syncope x long time - no relapse off vivactil   F/u HAs 2-3 per week - bad  The patient is here to follow up on chronic depression -  better, anxiety, headaches  F/u insomnia; grief. Better on Lexapro  Wt Readings from Last 3 Encounters:  01/30/13 194 lb (87.998 kg)  06/14/12 190 lb (86.183 kg)  04/13/12 194 lb (87.998 kg)   BP Readings from Last 3 Encounters:  01/30/13 120/90  06/14/12 108/84  04/13/12 110/90        Review of Systems  Constitutional: Positive for fatigue. Negative for chills, activity change, appetite change and unexpected weight change.  HENT: Negative for congestion, mouth sores and sinus pressure.   Eyes: Negative for visual disturbance.  Respiratory: Negative for cough and chest tightness.   Gastrointestinal: Negative for nausea and abdominal pain.  Genitourinary: Negative for frequency, difficulty urinating and vaginal pain.  Musculoskeletal: Negative for back pain and gait problem.  Skin: Negative for pallor and rash.  Neurological: Positive for headaches. Negative for dizziness, tremors, weakness and numbness.  Psychiatric/Behavioral: Negative for suicidal ideas, confusion and sleep disturbance. The patient is nervous/anxious.   Hair loss at the frontal hair line     Objective:   Physical Exam  Constitutional: She appears well-developed. No distress.  HENT:  Head: Normocephalic.  Right Ear: External ear normal.  Left Ear: External ear normal.  Nose: Nose normal.  Mouth/Throat: Oropharynx is clear and moist.  Eyes: Conjunctivae are normal. Pupils are equal, round, and reactive to light. Right eye exhibits no discharge. Left eye exhibits no discharge.  Neck: Normal range of motion. Neck supple. No JVD present. No tracheal deviation present. No thyromegaly present.  Cardiovascular: Normal rate, regular rhythm and  normal heart sounds.   Pulmonary/Chest: No stridor. No respiratory distress. She has no wheezes.  Abdominal: Soft. Bowel sounds are normal. She exhibits no distension and no mass. There is no tenderness. There is no rebound and no guarding.  Musculoskeletal: She exhibits no edema and no tenderness.  Lymphadenopathy:    She has no cervical adenopathy.  Neurological: She displays normal reflexes. No cranial nerve deficit. She exhibits normal muscle tone. Coordination normal.  Skin: No rash noted. No erythema.  Psychiatric: Her behavior is normal. Judgment and thought content normal.  Tearful, depressed  Hair loss at the frontal hair line  Lab Results  Component Value Date   WBC 7.5 01/30/2013   HGB 14.9 01/30/2013   HCT 44.0 01/30/2013   PLT 191.0 01/30/2013   GLUCOSE 121* 01/30/2013   CHOL 200 01/11/2012   TRIG 149.0 01/11/2012   HDL 36.90* 01/11/2012   LDLDIRECT 154.9 08/25/2010   LDLCALC 133* 01/11/2012   ALT 16 01/30/2013   AST 18 01/30/2013   NA 137 01/30/2013   K 4.5 01/30/2013   CL 104 01/30/2013   CREATININE 1.0 01/30/2013   BUN 13 01/30/2013   CO2 29 01/30/2013   TSH 46.07* 01/30/2013   INR 0.95 11/25/2009   HGBA1C 6.1 01/30/2013         Assessment & Plan:

## 2013-02-01 NOTE — Assessment & Plan Note (Signed)
11/14 s/p skin bx on scalp Per Dermatology

## 2013-02-01 NOTE — Assessment & Plan Note (Signed)
Continue with current prescription therapy as reflected on the Med list.  

## 2013-02-01 NOTE — Assessment & Plan Note (Signed)
Will get US 

## 2013-02-01 NOTE — Assessment & Plan Note (Signed)
Labs

## 2013-02-09 ENCOUNTER — Other Ambulatory Visit: Payer: PRIVATE HEALTH INSURANCE

## 2013-02-13 ENCOUNTER — Ambulatory Visit
Admission: RE | Admit: 2013-02-13 | Discharge: 2013-02-13 | Disposition: A | Discharge status: Home / Self Care | Payer: PRIVATE HEALTH INSURANCE | Source: Ambulatory Visit | Attending: Internal Medicine | Admitting: Internal Medicine

## 2013-02-14 ENCOUNTER — Other Ambulatory Visit: Payer: Self-pay | Admitting: Internal Medicine

## 2013-02-14 DIAGNOSIS — E069 Thyroiditis, unspecified: Secondary | ICD-10-CM

## 2013-03-05 ENCOUNTER — Encounter: Payer: Self-pay | Admitting: Internal Medicine

## 2013-03-05 ENCOUNTER — Ambulatory Visit (INDEPENDENT_AMBULATORY_CARE_PROVIDER_SITE_OTHER): Payer: PRIVATE HEALTH INSURANCE | Admitting: Internal Medicine

## 2013-03-05 VITALS — BP 124/80 | HR 65 | Temp 97.7°F | Ht 69.0 in | Wt 190.0 lb

## 2013-03-05 DIAGNOSIS — E039 Hypothyroidism, unspecified: Secondary | ICD-10-CM

## 2013-03-05 DIAGNOSIS — R5381 Other malaise: Secondary | ICD-10-CM

## 2013-03-05 DIAGNOSIS — R5383 Other fatigue: Secondary | ICD-10-CM

## 2013-03-05 LAB — TSH: TSH: 0.62 u[IU]/mL (ref 0.35–5.50)

## 2013-03-05 LAB — VITAMIN B12: Vitamin B-12: 363 pg/mL (ref 211–911)

## 2013-03-05 NOTE — Patient Instructions (Signed)
Please stop at the lab >> I will send you the results through MyChart. Please return in 4 months.

## 2013-03-05 NOTE — Progress Notes (Signed)
Patient ID: Autumn Howard, female   DOB: [birth year], 52 y.o.   MRN: [id number]   HPI  Autumn Howard is a 52 y.o.-year-old female, referred by her PCP, Dr. Posey Rea, for evaluation for hypothyroidism.  Pt. has been dx with hypothyroidism in 2008; Pt was on Synthroid 137 mcg, now switched to Armour 120 mg - 1 mo ago after a TSH increased to 46 (unclear why).  She takes the thyroid meds: - fasting - with water - separated by >30 min from dinner (when comes home from work) (works nights) - no calcium, iron, PPIs, multivitamins. She started to take Senokot several years back - 2x a day.  I reviewed pt's thyroid tests: Lab Results  Component Value Date   TSH 46.07* 01/30/2013   TSH 1.80 06/14/2012   TSH 0.64 04/11/2012   TSH 7.23* 01/11/2012   TSH 5.47 07/07/2011   TSH 11.05* 04/26/2011   TSH 8.89* 10/09/2010   TSH 19.27* 08/25/2010   TSH 10.06* 01/11/2007   FREET4 0.30* 01/30/2013    Recent thyroid U/S (02/13/2013): Thyroid size within normal limits and no discrete nodules, but there  is generalized heterogeneous thyroid echotexture and suggestion of hypervascularity. Query thyroiditis.  Pt denies feeling nodules in neck, hoarseness, dysphagia/odynophagia, SOB with lying down.  Pt describes: - + cold intolerance - + weight gain - +++ fatigue - tired even by taking a shower - + constipation - + hair falling - + depression - + poor sleep  She has no FH of thyroid disorders in. No FH of thyroid cancer.  No h/o radiation tx to head or neck.  No recent use of iodine supplements.  I reviewed her chart and she also has a history of frontal fibrosing alopecia (autoimmune). Also, per review of her chart, she had a low vit B12 (222) last mo >> started po B vitamins. Does not feel better.  ROS: Constitutional: see HPI Eyes: no blurry vision, no xerophthalmia ENT: no sore throat, + nodules palpated in throat, no dysphagia/odynophagia, no hoarseness Cardiovascular: no CP/SOB/palpitations/leg  swelling Respiratory: no cough/SOB Gastrointestinal: no N/V/D/C Musculoskeletal: no muscle/joint aches Skin: no rashes Neurological: no tremors/numbness/tingling/dizziness, + HA Psychiatric: + depression/+ anxiety Low libido  Past Medical History  Diagnosis Date  . Migraine headache   . Depression   . Anxiety   . Thyroid disease    No past surgical history on file. History   Social History  . Marital Status: Married    Spouse Name: N/A    Number of Children: 1, 69 y/o   Occupational History  . RN oncology - inpatient   Social History Main Topics  . Smoking status: Never Smoker   . Smokeless tobacco: Not on file  . Alcohol Use: Wine, occasionally  . Drug Use: No  . Sexual Activity: Yes   Current Outpatient Prescriptions on File Prior to Visit  Medication Sig Dispense Refill  . Butalbital-Acetaminophen (BUPAP) 50-300 MG TABS Take 1 tablet by mouth 2 (two) times daily as needed.  60 tablet  2  . Cyanocobalamin (VITAMIN B-12) 1000 MCG SUBL Place 1 tablet (1,000 mcg total) under the tongue daily.  100 tablet  3  . ketorolac (TORADOL) 10 MG tablet Take 1 tablet (10 mg total) by mouth every 6 (six) hours as needed for pain (migraines).  21 tablet  1  . ondansetron (ZOFRAN) 4 MG tablet Take 1 tablet (4 mg total) by mouth every 8 (eight) hours as needed for nausea.  20 tablet  2  .  predniSONE (DELTASONE) 10 MG tablet 1 po qd x 2-3 days prn severe migraine  30 tablet  1  . sennosides-docusate sodium (SENOKOT-S) 8.6-50 MG tablet Take 2 tablets by mouth 2 (two) times daily.      Marland Kitchen thyroid (ARMOUR THYROID) 120 MG tablet Take 1 tablet (120 mg total) by mouth daily before breakfast.  30 tablet  5  . topiramate (TOPAMAX) 100 MG tablet Take 1 tablet (100 mg total) by mouth 2 (two) times daily.  60 tablet  5  . cholecalciferol (VITAMIN D) 1000 UNITS tablet Take 1,000 Units by mouth daily.      . polyethylene glycol (MIRALAX / GLYCOLAX) packet Take 17 g by mouth daily as needed. For  constipation       No current facility-administered medications on file prior to visit.   Allergies  Allergen Reactions  . Imitrex [Sumatriptan] Other (See Comments)    palpitations  . Maxalt [Rizatriptan Benzoate] Other (See Comments)    palpitations  . Vivactil [Protriptyline Hcl]     Near-syncope   Family History  Problem Relation Age of Onset  . Cancer Mother     lungs  . Heart disease Father   . Cancer Father     lung   PE: BP 124/80  Pulse 65  Temp(Src) 97.7 F (36.5 C) (Oral)  Ht 5\' 9"  (1.753 m)  Wt 190 lb (86.183 kg)  BMI 28.05 kg/m2 Wt Readings from Last 3 Encounters:  03/05/13 190 lb (86.183 kg)  01/30/13 194 lb (87.998 kg)  06/14/12 190 lb (86.183 kg)   Constitutional: overweight, in NAD Eyes: PERRLA, EOMI, no exophthalmos ENT: moist mucous membranes, + firm thyroid, no thyromegaly, no cervical lymphadenopathy Cardiovascular: RRR, No MRG Respiratory: CTA B Gastrointestinal: abdomen soft, NT, ND, BS+ Musculoskeletal: no deformities, strength intact in all 4 Skin: moist, warm, no rashes Neurological: no tremor with outstretched hands, DTR normal in all 4  ASSESSMENT: 1. Hypothyroidism - active Hashimoto's thyroiditis most likely - thyroid U/S: no nodules  2. Fatigue - low B12  PLAN:  1. Patient with long-standing hypothyroidism, on Armour therapy for the last mo. She does not feel a change in her fatigue after starting a rather large dose of Armour thyroid (120 mg is the equivalent of 200 mcg Synthroid, previously on 137). She appears euthyroid. - We discussed about correct intake of levothyroxine, fasting, with water, separated by at least 30 minutes from breakfast, and separated by more than 4 hours from calcium, iron, multivitamins, acid reflux medications (PPIs). - She does not appear to have a goiter, thyroid nodules, or neck compression symptoms - we'll check thyroid tests today: TSH, free T4, free T3 - If these are abnormal, she will need to  return in 6-8 weeks for repeat labs - If these are normal, I will see her back in 4 months - explained that she might feel hypothyroid, even if the TSH returns to normal, if she has high thyroid antibodies. Will not check thyroid antibodies today, since there is no treatment for this, but we'll keep this in mind for the etiology for her fatigue  2. Fatigue - please see above - reviewing her chart, she also has low vitamin B12 - she started B Vitamins - we will recheck her vitamin B12 and check her vitamin D today  Office Visit on 03/05/2013  Component Date Value Range Status  . TSH 03/05/2013 0.62  0.35 - 5.50 uIU/mL Final  . Free T4 03/05/2013 0.63  0.60 - 1.60 ng/dL  Final  . T3, Free 03/05/2013 3.5  2.3 - 4.2 pg/mL Final  . Vit D, 25-Hydroxy 03/05/2013 29* 30 - 89 ng/mL Final   Comment: This assay accurately quantifies Vitamin D, which is the sum of the                          25-Hydroxy forms of Vitamin D2 and D3.  Studies have shown that the                          optimum concentration of 25-Hydroxy Vitamin D is 30 ng/mL or higher.                           Concentrations of Vitamin D between 20 and 29 ng/mL are considered to                          be insufficient and concentrations less than 20 ng/mL are considered                          to be deficient for Vitamin D.  . Vitamin B-12 03/05/2013 363  211 - 911 pg/mL Final   Msg sent: Dear Ms Autumn Howard, Your thyroid tests are excellent, so suggest that you continue your current dose of Armour Thyroid. We will recheck the tests when you return in 4 months. In the meantime, please let me know if you need a refill. Your vitamin D is just a little bit low, please start taking 2000 units daily of the over-the-counter vitamin D. The vitamin B12 is better, but still on the low side, so I would still consider getting im injections once a month, at least until you build your B12 stores. Please let me know if you have any  questions. Sincerely, Carlus Pavlov MD

## 2013-03-06 LAB — VITAMIN D 25 HYDROXY (VIT D DEFICIENCY, FRACTURES): Vit D, 25-Hydroxy: 29 ng/mL — ABNORMAL LOW (ref 30–89)

## 2013-06-07 NOTE — Telephone Encounter (Signed)
This encounter was created in error - please disregard.

## 2013-10-22 ENCOUNTER — Encounter: Payer: Self-pay | Admitting: Internal Medicine

## 2013-10-22 ENCOUNTER — Ambulatory Visit (INDEPENDENT_AMBULATORY_CARE_PROVIDER_SITE_OTHER): Payer: PRIVATE HEALTH INSURANCE | Admitting: Internal Medicine

## 2013-10-22 VITALS — BP 112/80 | HR 80 | Temp 98.1°F | Resp 16 | Wt 218.0 lb

## 2013-10-22 DIAGNOSIS — G43909 Migraine, unspecified, not intractable, without status migrainosus: Secondary | ICD-10-CM

## 2013-10-22 DIAGNOSIS — F3289 Other specified depressive episodes: Secondary | ICD-10-CM

## 2013-10-22 DIAGNOSIS — E063 Autoimmune thyroiditis: Secondary | ICD-10-CM

## 2013-10-22 DIAGNOSIS — E538 Deficiency of other specified B group vitamins: Secondary | ICD-10-CM

## 2013-10-22 DIAGNOSIS — E559 Vitamin D deficiency, unspecified: Secondary | ICD-10-CM

## 2013-10-22 DIAGNOSIS — F329 Major depressive disorder, single episode, unspecified: Secondary | ICD-10-CM

## 2013-10-22 DIAGNOSIS — R739 Hyperglycemia, unspecified: Secondary | ICD-10-CM

## 2013-10-22 DIAGNOSIS — R55 Syncope and collapse: Secondary | ICD-10-CM

## 2013-10-22 DIAGNOSIS — R7309 Other abnormal glucose: Secondary | ICD-10-CM

## 2013-10-22 MED ORDER — TOPIRAMATE 100 MG PO TABS: 100.0000 mg | ORAL_TABLET | Freq: Two times a day (BID) | ORAL | Status: DC

## 2013-10-22 MED ORDER — ESCITALOPRAM OXALATE 20 MG PO TABS: 30.0000 mg | ORAL_TABLET | Freq: Every day | ORAL | Status: DC

## 2013-10-22 MED ORDER — ONDANSETRON HCL 4 MG PO TABS: 4.0000 mg | ORAL_TABLET | Freq: Three times a day (TID) | ORAL | Status: DC | PRN

## 2013-10-22 MED ORDER — VITAMIN B-12 1000 MCG SL SUBL: 1.0000 | SUBLINGUAL_TABLET | Freq: Every day | SUBLINGUAL | Status: DC

## 2013-10-22 MED ORDER — KETOROLAC TROMETHAMINE 10 MG PO TABS: 10.0000 mg | ORAL_TABLET | Freq: Four times a day (QID) | ORAL | Status: DC | PRN

## 2013-10-22 MED ORDER — BUTALBITAL-ACETAMINOPHEN 50-300 MG PO TABS: 1.0000 | ORAL_TABLET | Freq: Two times a day (BID) | ORAL | Status: DC | PRN

## 2013-10-22 MED ORDER — LEVOTHYROXINE SODIUM 137 MCG PO TABS: 137.0000 ug | ORAL_TABLET | Freq: Every day | ORAL | Status: DC

## 2013-10-22 NOTE — Progress Notes (Signed)
   Subjective:     HPI  Faelynn stopped her meds 2 months ago for ?reason  F/u orthostatic near syncope x long time - no relapse off vivactil   F/u HAs 2-3 per week - bad again  The patient is here to follow up on chronic depression -  better, anxiety, headaches  F/u insomnia; grief.   Wt Readings from Last 3 Encounters:  10/22/13 218 lb (98.884 kg)  03/05/13 190 lb (86.183 kg)  01/30/13 194 lb (87.998 kg)   BP Readings from Last 3 Encounters:  10/22/13 112/80  03/05/13 124/80  01/30/13 120/90        Review of Systems  Constitutional: Positive for fatigue and unexpected weight change. Negative for chills, activity change and appetite change.  HENT: Negative for congestion, mouth sores and sinus pressure.   Eyes: Negative for visual disturbance.  Respiratory: Negative for cough and chest tightness.   Gastrointestinal: Negative for nausea and abdominal pain.  Genitourinary: Negative for frequency, difficulty urinating and vaginal pain.  Musculoskeletal: Negative for back pain and gait problem.  Skin: Negative for pallor and rash.  Neurological: Positive for headaches. Negative for dizziness, tremors, weakness and numbness.  Psychiatric/Behavioral: Negative for suicidal ideas, confusion and sleep disturbance. The patient is nervous/anxious.        Objective:   Physical Exam  Constitutional: She appears well-developed. No distress.  Obese  HENT:  Head: Normocephalic.  Right Ear: External ear normal.  Left Ear: External ear normal.  Nose: Nose normal.  Mouth/Throat: Oropharynx is clear and moist.  Eyes: Conjunctivae are normal. Pupils are equal, round, and reactive to light. Right eye exhibits no discharge. Left eye exhibits no discharge.  Neck: Normal range of motion. Neck supple. No JVD present. No tracheal deviation present. No thyromegaly present.  Cardiovascular: Normal rate, regular rhythm and normal heart sounds.   Pulmonary/Chest: No stridor. No  respiratory distress. She has no wheezes.  Abdominal: Soft. Bowel sounds are normal. She exhibits no distension and no mass. There is no tenderness. There is no rebound and no guarding.  Musculoskeletal: She exhibits no edema and no tenderness.  Lymphadenopathy:    She has no cervical adenopathy.  Neurological: She displays normal reflexes. No cranial nerve deficit. She exhibits normal muscle tone. Coordination normal.  Skin: No rash noted. No erythema.  Psychiatric: Her behavior is normal. Judgment and thought content normal.  Not tearful, depressed    Lab Results  Component Value Date   WBC 7.5 01/30/2013   HGB 14.9 01/30/2013   HCT 44.0 01/30/2013   PLT 191.0 01/30/2013   GLUCOSE 121* 01/30/2013   CHOL 200 01/11/2012   TRIG 149.0 01/11/2012   HDL 36.90* 01/11/2012   LDLDIRECT 154.9 08/25/2010   LDLCALC 133* 01/11/2012   ALT 16 01/30/2013   AST 18 01/30/2013   NA 137 01/30/2013   K 4.5 01/30/2013   CL 104 01/30/2013   CREATININE 1.0 01/30/2013   BUN 13 01/30/2013   CO2 29 01/30/2013   TSH 0.62 03/05/2013   INR 0.95 11/25/2009   HGBA1C 6.1 01/30/2013         Assessment & Plan:

## 2013-10-22 NOTE — Assessment & Plan Note (Signed)
She wants to go back on Synthroid

## 2013-10-22 NOTE — Assessment & Plan Note (Signed)
Re-start Rx 

## 2013-10-22 NOTE — Assessment & Plan Note (Signed)
Continue with current prescription therapy as reflected on the Med list.  

## 2013-10-22 NOTE — Assessment & Plan Note (Signed)
No recent relapse 

## 2013-10-22 NOTE — Assessment & Plan Note (Signed)
Labs

## 2013-10-22 NOTE — Assessment & Plan Note (Signed)
Re- start Vit B12 °

## 2013-10-22 NOTE — Progress Notes (Signed)
Pre visit review using our clinic review tool, if applicable. No additional management support is needed unless otherwise documented below in the visit note. 

## 2013-11-07 DIAGNOSIS — Z0279 Encounter for issue of other medical certificate: Secondary | ICD-10-CM

## 2014-04-09 ENCOUNTER — Encounter: Payer: Self-pay | Admitting: Internal Medicine

## 2014-04-09 ENCOUNTER — Ambulatory Visit (INDEPENDENT_AMBULATORY_CARE_PROVIDER_SITE_OTHER): Payer: PRIVATE HEALTH INSURANCE | Admitting: Internal Medicine

## 2014-04-09 ENCOUNTER — Ambulatory Visit (INDEPENDENT_AMBULATORY_CARE_PROVIDER_SITE_OTHER)
Admission: RE | Admit: 2014-04-09 | Discharge: 2014-04-09 | Disposition: A | Discharge status: Home / Self Care | Payer: PRIVATE HEALTH INSURANCE | Source: Ambulatory Visit | Attending: Internal Medicine | Admitting: Internal Medicine

## 2014-04-09 VITALS — BP 152/104 | HR 78 | Temp 98.1°F | Wt 230.0 lb

## 2014-04-09 DIAGNOSIS — Z0000 Encounter for general adult medical examination without abnormal findings: Secondary | ICD-10-CM

## 2014-04-09 DIAGNOSIS — E538 Deficiency of other specified B group vitamins: Secondary | ICD-10-CM

## 2014-04-09 DIAGNOSIS — R739 Hyperglycemia, unspecified: Secondary | ICD-10-CM

## 2014-04-09 DIAGNOSIS — F331 Major depressive disorder, recurrent, moderate: Secondary | ICD-10-CM

## 2014-04-09 DIAGNOSIS — E559 Vitamin D deficiency, unspecified: Secondary | ICD-10-CM

## 2014-04-09 DIAGNOSIS — E039 Hypothyroidism, unspecified: Secondary | ICD-10-CM | POA: Insufficient documentation

## 2014-04-09 DIAGNOSIS — R05 Cough: Secondary | ICD-10-CM

## 2014-04-09 DIAGNOSIS — E038 Other specified hypothyroidism: Secondary | ICD-10-CM

## 2014-04-09 DIAGNOSIS — E063 Autoimmune thyroiditis: Secondary | ICD-10-CM

## 2014-04-09 DIAGNOSIS — F4323 Adjustment disorder with mixed anxiety and depressed mood: Secondary | ICD-10-CM

## 2014-04-09 DIAGNOSIS — R059 Cough, unspecified: Secondary | ICD-10-CM

## 2014-04-09 DIAGNOSIS — G43109 Migraine with aura, not intractable, without status migrainosus: Secondary | ICD-10-CM

## 2014-04-09 DIAGNOSIS — E034 Atrophy of thyroid (acquired): Secondary | ICD-10-CM

## 2014-04-09 MED ORDER — DULOXETINE HCL 30 MG PO CPEP: 30.0000 mg | ORAL_CAPSULE | Freq: Every day | ORAL | Status: DC

## 2014-04-09 MED ORDER — PROMETHAZINE-CODEINE 6.25-10 MG/5ML PO SYRP: 5.0000 mL | ORAL_SOLUTION | ORAL | Status: DC | PRN

## 2014-04-09 NOTE — Progress Notes (Signed)
Pre visit review using our clinic review tool, if applicable. No additional management support is needed unless otherwise documented below in the visit note. 

## 2014-04-09 NOTE — Patient Instructions (Signed)
Low carb diet 

## 2014-04-09 NOTE — Assessment & Plan Note (Addendum)
BUPAP, Toradol prn To work next wk

## 2014-04-09 NOTE — Assessment & Plan Note (Addendum)
Start Cymbalta Treat B12 def, hypothyroidism, HAs Labs To work next week

## 2014-04-09 NOTE — Assessment & Plan Note (Signed)
Re-start Rx Labs Risks associated with treatment noncompliance were discussed. Compliance was encouraged.

## 2014-04-09 NOTE — Assessment & Plan Note (Addendum)
?  bronchitis CXR See meds To work next week

## 2014-04-09 NOTE — Assessment & Plan Note (Signed)
Risks associated with treatment noncompliance were discussed. Compliance was encouraged. Start Cymbalta Re-start Levoxyl

## 2014-04-09 NOTE — Progress Notes (Deleted)
Pre visit review using our clinic review tool, if applicable. No additional management support is needed unless otherwise documented below in the visit note. 

## 2014-04-09 NOTE — Progress Notes (Signed)
   Subjective:     HPI The patient is here for a wellness exam.  C/o wt gain, elev BP, depression. Not taking any meds for unknown reason... C/o cough x 1 week  F/u orthostatic near syncope x long time - no relapse off vivactil   F/u HAs 2-3 per week - bad again  The patient is here to follow up on chronic depression -  Not better, f/u anxiety, headaches  F/u insomnia; grief.   Wt Readings from Last 3 Encounters:  04/09/14 230 lb (104.327 kg)  10/22/13 218 lb (98.884 kg)  03/05/13 190 lb (86.183 kg)   BP Readings from Last 3 Encounters:  04/09/14 152/104  10/22/13 112/80  03/05/13 124/80        Review of Systems  Constitutional: Positive for fatigue and unexpected weight change. Negative for chills, activity change and appetite change.  HENT: Negative for congestion, mouth sores and sinus pressure.   Eyes: Negative for visual disturbance.  Respiratory: Negative for cough and chest tightness.   Gastrointestinal: Negative for nausea and abdominal pain.  Genitourinary: Negative for frequency, difficulty urinating and vaginal pain.  Musculoskeletal: Negative for back pain and gait problem.  Skin: Negative for pallor and rash.  Neurological: Positive for headaches. Negative for dizziness, tremors, weakness and numbness.  Psychiatric/Behavioral: Negative for suicidal ideas, confusion and sleep disturbance. The patient is nervous/anxious.        Objective:   Physical Exam  Constitutional: She appears well-developed. No distress.  Obese NAD  HENT:  Head: Normocephalic.  Right Ear: External ear normal.  Left Ear: External ear normal.  Nose: Nose normal.  Mouth/Throat: Oropharynx is clear and moist.  Eyes: Conjunctivae are normal. Pupils are equal, round, and reactive to light. Right eye exhibits no discharge. Left eye exhibits no discharge.  Neck: Normal range of motion. Neck supple. No JVD present. No tracheal deviation present. No thyromegaly present.   Cardiovascular: Normal rate, regular rhythm and normal heart sounds.   Pulmonary/Chest: No stridor. No respiratory distress. She has no wheezes.  Abdominal: Soft. Bowel sounds are normal. She exhibits no distension and no mass. There is no tenderness. There is no rebound and no guarding.  Musculoskeletal: She exhibits no edema or tenderness.  Lymphadenopathy:    She has no cervical adenopathy.  Neurological: She displays normal reflexes. No cranial nerve deficit. She exhibits normal muscle tone. Coordination normal.  Skin: No rash noted. No erythema.  Psychiatric: Her behavior is normal. Judgment and thought content normal.  Not tearful, depressed    Lab Results  Component Value Date   WBC 7.5 01/30/2013   HGB 14.9 01/30/2013   HCT 44.0 01/30/2013   PLT 191.0 01/30/2013   GLUCOSE 121* 01/30/2013   CHOL 200 01/11/2012   TRIG 149.0 01/11/2012   HDL 36.90* 01/11/2012   LDLDIRECT 154.9 08/25/2010   LDLCALC 133* 01/11/2012   ALT 16 01/30/2013   AST 18 01/30/2013   NA 137 01/30/2013   K 4.5 01/30/2013   CL 104 01/30/2013   CREATININE 1.0 01/30/2013   BUN 13 01/30/2013   CO2 29 01/30/2013   TSH 0.62 03/05/2013   INR 0.95 11/25/2009   HGBA1C 6.1 01/30/2013         Assessment & Plan:  Patient ID: Autumn Howard, female   DOB: 16-Jul-1960, 54 y.o.   MRN: [id number]

## 2014-04-09 NOTE — Assessment & Plan Note (Signed)
We discussed age appropriate health related issues, including available/recomended screening tests and vaccinations. We discussed a need for adhering to healthy diet and exercise. Labs/EKG were reviewed/ordered. All questions were answered.  Labs 

## 2014-04-09 NOTE — Assessment & Plan Note (Addendum)
Labs Re-start Rx Risks associated with treatment noncompliance were discussed. Compliance was encouraged.

## 2014-04-09 NOTE — Assessment & Plan Note (Signed)
Labs

## 2014-04-10 ENCOUNTER — Other Ambulatory Visit (INDEPENDENT_AMBULATORY_CARE_PROVIDER_SITE_OTHER): Payer: PRIVATE HEALTH INSURANCE

## 2014-04-10 DIAGNOSIS — R05 Cough: Secondary | ICD-10-CM

## 2014-04-10 DIAGNOSIS — E063 Autoimmune thyroiditis: Secondary | ICD-10-CM

## 2014-04-10 DIAGNOSIS — R7989 Other specified abnormal findings of blood chemistry: Secondary | ICD-10-CM

## 2014-04-10 DIAGNOSIS — F331 Major depressive disorder, recurrent, moderate: Secondary | ICD-10-CM

## 2014-04-10 DIAGNOSIS — E538 Deficiency of other specified B group vitamins: Secondary | ICD-10-CM

## 2014-04-10 DIAGNOSIS — R739 Hyperglycemia, unspecified: Secondary | ICD-10-CM

## 2014-04-10 DIAGNOSIS — R059 Cough, unspecified: Secondary | ICD-10-CM

## 2014-04-10 DIAGNOSIS — G43109 Migraine with aura, not intractable, without status migrainosus: Secondary | ICD-10-CM

## 2014-04-10 LAB — BASIC METABOLIC PANEL
BUN: 16 mg/dL (ref 6–23)
CALCIUM: 9.3 mg/dL (ref 8.4–10.5)
CO2: 29 mEq/L (ref 19–32)
Chloride: 103 mEq/L (ref 96–112)
Creatinine, Ser: 1.15 mg/dL (ref 0.40–1.20)
GFR: 52.18 mL/min — ABNORMAL LOW (ref >60.00)
Glucose, Bld: 202 mg/dL — ABNORMAL HIGH (ref 70–99)
Potassium: 4.4 mEq/L (ref 3.5–5.1)
SODIUM: 137 meq/L (ref 135–145)

## 2014-04-10 LAB — CBC WITH DIFFERENTIAL/PLATELET
Basophils Absolute: 0.1 10*3/uL (ref 0.0–0.1)
Basophils Relative: 1.3 % (ref 0.0–3.0)
Eosinophils Absolute: 0.2 10*3/uL (ref 0.0–0.7)
Eosinophils Relative: 3.2 % (ref 0.0–5.0)
HCT: 41.8 % (ref 36.0–46.0)
Hemoglobin: 14.5 g/dL (ref 12.0–15.0)
LYMPHS ABS: 2.6 10*3/uL (ref 0.7–4.0)
LYMPHS PCT: 37.4 % (ref 12.0–46.0)
MCHC: 34.6 g/dL (ref 30.0–36.0)
MCV: 88.8 fl (ref 78.0–100.0)
MONO ABS: 0.3 10*3/uL (ref 0.1–1.0)
MONOS PCT: 5 % (ref 3.0–12.0)
Neutro Abs: 3.6 10*3/uL (ref 1.4–7.7)
Neutrophils Relative %: 53.1 % (ref 43.0–77.0)
PLATELETS: 188 10*3/uL (ref 150.0–400.0)
RBC: 4.71 Mil/uL (ref 3.87–5.11)
RDW: 15 % (ref 11.5–15.5)
WBC: 6.8 10*3/uL (ref 4.0–10.5)

## 2014-04-10 LAB — URINALYSIS, ROUTINE W REFLEX MICROSCOPIC
Bilirubin Urine: NEGATIVE
Hgb urine dipstick: NEGATIVE
Ketones, ur: NEGATIVE
NITRITE: NEGATIVE
RBC / HPF: NONE SEEN (ref 0–?)
SPECIFIC GRAVITY, URINE: 1.025 (ref 1.000–1.030)
Total Protein, Urine: NEGATIVE
Urine Glucose: NEGATIVE
Urobilinogen, UA: 0.2 (ref 0.0–1.0)
pH: 6 (ref 5.0–8.0)

## 2014-04-10 LAB — HEPATIC FUNCTION PANEL
ALT: 19 U/L (ref 0–35)
AST: 17 U/L (ref 0–37)
Albumin: 4.4 g/dL (ref 3.5–5.2)
Alkaline Phosphatase: 87 U/L (ref 39–117)
BILIRUBIN DIRECT: 0.1 mg/dL (ref 0.0–0.3)
BILIRUBIN TOTAL: 0.7 mg/dL (ref 0.2–1.2)
TOTAL PROTEIN: 7.2 g/dL (ref 6.0–8.3)

## 2014-04-10 LAB — LIPID PANEL
Cholesterol: 262 mg/dL — ABNORMAL HIGH (ref 0–200)
HDL: 43.2 mg/dL (ref >39.00)
Total CHOL/HDL Ratio: 6

## 2014-04-10 LAB — TSH: TSH: 73.45 u[IU]/mL — ABNORMAL HIGH (ref 0.35–4.50)

## 2014-04-10 LAB — HEMOGLOBIN A1C: HEMOGLOBIN A1C: 8.5 % — AB (ref 4.6–6.5)

## 2014-04-10 LAB — LDL CHOLESTEROL, DIRECT: Direct LDL: 135 mg/dL

## 2014-04-10 LAB — VITAMIN B12: Vitamin B-12: 213 pg/mL (ref 211–911)

## 2014-04-11 ENCOUNTER — Encounter: Payer: Self-pay | Admitting: Internal Medicine

## 2014-04-12 ENCOUNTER — Other Ambulatory Visit (INDEPENDENT_AMBULATORY_CARE_PROVIDER_SITE_OTHER): Payer: PRIVATE HEALTH INSURANCE

## 2014-04-12 DIAGNOSIS — E038 Other specified hypothyroidism: Secondary | ICD-10-CM

## 2014-04-12 DIAGNOSIS — E559 Vitamin D deficiency, unspecified: Secondary | ICD-10-CM

## 2014-04-12 DIAGNOSIS — E063 Autoimmune thyroiditis: Secondary | ICD-10-CM

## 2014-04-12 LAB — T4: T4 TOTAL: 1 ug/dL — AB (ref 4.5–12.0)

## 2014-04-12 LAB — T3: T3 TOTAL: 34.6 ng/dL — AB (ref 80.0–204.0)

## 2014-04-12 LAB — VITAMIN D 25 HYDROXY (VIT D DEFICIENCY, FRACTURES): VITD: 16.53 ng/mL — ABNORMAL LOW (ref 30.00–100.00)

## 2015-02-06 DIAGNOSIS — L249 Irritant contact dermatitis, unspecified cause: Secondary | ICD-10-CM | POA: Insufficient documentation

## 2015-02-06 DIAGNOSIS — D492 Neoplasm of unspecified behavior of bone, soft tissue, and skin: Secondary | ICD-10-CM | POA: Insufficient documentation

## 2015-05-22 ENCOUNTER — Encounter: Payer: Self-pay | Admitting: Internal Medicine

## 2015-05-22 ENCOUNTER — Other Ambulatory Visit (INDEPENDENT_AMBULATORY_CARE_PROVIDER_SITE_OTHER): Payer: PRIVATE HEALTH INSURANCE

## 2015-05-22 ENCOUNTER — Ambulatory Visit (INDEPENDENT_AMBULATORY_CARE_PROVIDER_SITE_OTHER): Payer: PRIVATE HEALTH INSURANCE | Admitting: Internal Medicine

## 2015-05-22 VITALS — BP 120/78 | HR 107 | Wt 224.0 lb

## 2015-05-22 DIAGNOSIS — E038 Other specified hypothyroidism: Secondary | ICD-10-CM

## 2015-05-22 DIAGNOSIS — G43109 Migraine with aura, not intractable, without status migrainosus: Secondary | ICD-10-CM | POA: Diagnosis not present

## 2015-05-22 DIAGNOSIS — R222 Localized swelling, mass and lump, trunk: Secondary | ICD-10-CM

## 2015-05-22 DIAGNOSIS — E538 Deficiency of other specified B group vitamins: Secondary | ICD-10-CM

## 2015-05-22 DIAGNOSIS — R739 Hyperglycemia, unspecified: Secondary | ICD-10-CM

## 2015-05-22 DIAGNOSIS — E034 Atrophy of thyroid (acquired): Secondary | ICD-10-CM

## 2015-05-22 DIAGNOSIS — G43909 Migraine, unspecified, not intractable, without status migrainosus: Secondary | ICD-10-CM

## 2015-05-22 DIAGNOSIS — F411 Generalized anxiety disorder: Secondary | ICD-10-CM

## 2015-05-22 DIAGNOSIS — R5383 Other fatigue: Secondary | ICD-10-CM

## 2015-05-22 DIAGNOSIS — E559 Vitamin D deficiency, unspecified: Secondary | ICD-10-CM

## 2015-05-22 DIAGNOSIS — E010 Iodine-deficiency related diffuse (endemic) goiter: Secondary | ICD-10-CM

## 2015-05-22 DIAGNOSIS — E049 Nontoxic goiter, unspecified: Secondary | ICD-10-CM

## 2015-05-22 DIAGNOSIS — F329 Major depressive disorder, single episode, unspecified: Secondary | ICD-10-CM

## 2015-05-22 DIAGNOSIS — L661 Lichen planopilaris: Secondary | ICD-10-CM

## 2015-05-22 DIAGNOSIS — E039 Hypothyroidism, unspecified: Secondary | ICD-10-CM

## 2015-05-22 LAB — BASIC METABOLIC PANEL
BUN: 25 mg/dL — ABNORMAL HIGH (ref 6–23)
CALCIUM: 9.5 mg/dL (ref 8.4–10.5)
CO2: 28 meq/L (ref 19–32)
CREATININE: 1.07 mg/dL (ref 0.40–1.20)
Chloride: 102 mEq/L (ref 96–112)
GFR: 56.47 mL/min — AB (ref >60.00)
Glucose, Bld: 190 mg/dL — ABNORMAL HIGH (ref 70–99)
Potassium: 3.5 mEq/L (ref 3.5–5.1)
SODIUM: 137 meq/L (ref 135–145)

## 2015-05-22 LAB — T3, FREE: T3 FREE: 2.7 pg/mL (ref 2.3–4.2)

## 2015-05-22 LAB — CBC WITH DIFFERENTIAL/PLATELET
BASOS PCT: 0.8 % (ref 0.0–3.0)
Basophils Absolute: 0.1 10*3/uL (ref 0.0–0.1)
EOS PCT: 2.2 % (ref 0.0–5.0)
Eosinophils Absolute: 0.2 10*3/uL (ref 0.0–0.7)
HCT: 42.1 % (ref 36.0–46.0)
Hemoglobin: 14.3 g/dL (ref 12.0–15.0)
LYMPHS ABS: 3.1 10*3/uL (ref 0.7–4.0)
Lymphocytes Relative: 30.2 % (ref 12.0–46.0)
MCHC: 33.9 g/dL (ref 30.0–36.0)
MCV: 89.9 fl (ref 78.0–100.0)
MONO ABS: 0.6 10*3/uL (ref 0.1–1.0)
MONOS PCT: 6.4 % (ref 3.0–12.0)
NEUTROS ABS: 6.1 10*3/uL (ref 1.4–7.7)
NEUTROS PCT: 60.4 % (ref 43.0–77.0)
PLATELETS: 232 10*3/uL (ref 150.0–400.0)
RBC: 4.68 Mil/uL (ref 3.87–5.11)
RDW: 14.4 % (ref 11.5–15.5)
WBC: 10.1 10*3/uL (ref 4.0–10.5)

## 2015-05-22 LAB — VITAMIN D 25 HYDROXY (VIT D DEFICIENCY, FRACTURES): VITD: 16.05 ng/mL — AB (ref 30.00–100.00)

## 2015-05-22 LAB — HEPATIC FUNCTION PANEL
ALT: 17 U/L (ref 0–35)
AST: 13 U/L (ref 0–37)
Albumin: 4.4 g/dL (ref 3.5–5.2)
Alkaline Phosphatase: 89 U/L (ref 39–117)
BILIRUBIN DIRECT: 0.1 mg/dL (ref 0.0–0.3)
TOTAL PROTEIN: 7.7 g/dL (ref 6.0–8.3)
Total Bilirubin: 0.5 mg/dL (ref 0.2–1.2)

## 2015-05-22 LAB — URINALYSIS
BILIRUBIN URINE: NEGATIVE
Hgb urine dipstick: NEGATIVE
KETONES UR: NEGATIVE
LEUKOCYTES UA: NEGATIVE
Nitrite: NEGATIVE
PH: 5.5 (ref 5.0–8.0)
Specific Gravity, Urine: 1.025 (ref 1.000–1.030)
Total Protein, Urine: NEGATIVE
URINE GLUCOSE: 100 — AB
UROBILINOGEN UA: 0.2 (ref 0.0–1.0)

## 2015-05-22 LAB — HEPATITIS C ANTIBODY: HCV AB: NEGATIVE

## 2015-05-22 LAB — TSH: TSH: 41.5 u[IU]/mL — AB (ref 0.35–4.50)

## 2015-05-22 LAB — VITAMIN B12: VITAMIN B 12: 274 pg/mL (ref 211–911)

## 2015-05-22 MED ORDER — VITAMIN D 1000 UNITS PO TABS: 2000.0000 [IU] | ORAL_TABLET | Freq: Every day | ORAL | Status: DC

## 2015-05-22 MED ORDER — VITAMIN B-12 1000 MCG SL SUBL: 1.0000 | SUBLINGUAL_TABLET | Freq: Every day | SUBLINGUAL | Status: DC

## 2015-05-22 MED ORDER — FLUOXETINE HCL 10 MG PO TABS: 10.0000 mg | ORAL_TABLET | Freq: Every day | ORAL | Status: DC

## 2015-05-22 MED ORDER — ERGOCALCIFEROL 1.25 MG (50000 UT) PO CAPS: 50000.0000 [IU] | ORAL_CAPSULE | ORAL | Status: DC

## 2015-05-22 MED ORDER — KETOROLAC TROMETHAMINE 10 MG PO TABS: 10.0000 mg | ORAL_TABLET | Freq: Four times a day (QID) | ORAL | Status: DC | PRN

## 2015-05-22 MED ORDER — LEVOTHYROXINE SODIUM 137 MCG PO TABS: 137.0000 ug | ORAL_TABLET | Freq: Every day | ORAL | Status: DC

## 2015-05-22 MED ORDER — ONDANSETRON HCL 4 MG PO TABS: 4.0000 mg | ORAL_TABLET | Freq: Three times a day (TID) | ORAL | Status: DC | PRN

## 2015-05-22 MED ORDER — BUTALBITAL-ACETAMINOPHEN 50-300 MG PO TABS: 1.0000 | ORAL_TABLET | Freq: Two times a day (BID) | ORAL | Status: DC | PRN

## 2015-05-22 NOTE — Progress Notes (Signed)
Subjective:  Patient ID: Autumn Howard, female    DOB: 09-Sep-1960  Age: 55 y.o. MRN: [id number]  CC: Migraine   HPI [name] presents for migraine HAs - worse; 2 /wk. F/u depression, hypothyroidism, Vit D and B 12 deficiency - not taking any meds for ?reasons...  Outpatient Prescriptions Prior to Visit  Medication Sig Dispense Refill  . cholecalciferol (VITAMIN D) 1000 UNITS tablet Take 1,000 Units by mouth daily.    . Cyanocobalamin (VITAMIN B-12) 1000 MCG SUBL Place 1 tablet (1,000 mcg total) under the tongue daily. (Patient not taking: Reported on 05/22/2015) 100 tablet 3  . DULoxetine (CYMBALTA) 30 MG capsule Take 1 capsule (30 mg total) by mouth daily. (Patient not taking: Reported on 05/22/2015) 30 capsule 5  . Butalbital-Acetaminophen (BUPAP) 50-300 MG TABS Take 1 tablet by mouth 2 (two) times daily as needed. (Patient not taking: Reported on 05/22/2015) 60 tablet 2  . ketorolac (TORADOL) 10 MG tablet Take 1 tablet (10 mg total) by mouth every 6 (six) hours as needed. (Patient not taking: Reported on 05/22/2015) 21 tablet 1  . levothyroxine (SYNTHROID) 137 MCG tablet Take 1 tablet (137 mcg total) by mouth daily before breakfast. (Patient not taking: Reported on 05/22/2015) 90 tablet 3  . ondansetron (ZOFRAN) 4 MG tablet Take 1 tablet (4 mg total) by mouth every 8 (eight) hours as needed for nausea. (Patient not taking: Reported on 05/22/2015) 20 tablet 2  . promethazine-codeine (PHENERGAN WITH CODEINE) 6.25-10 MG/5ML syrup Take 5 mLs by mouth every 4 (four) hours as needed. (Patient not taking: Reported on 05/22/2015) 300 mL 0  . tacrolimus (PROTOPIC) 0.1 % ointment Apply topically. Reported on 05/22/2015    . topiramate (TOPAMAX) 100 MG tablet Take 1 tablet (100 mg total) by mouth 2 (two) times daily. (Patient not taking: Reported on 05/22/2015) 180 tablet 2   No facility-administered medications prior to visit.    ROS Review of Systems  Constitutional: Negative for chills,  activity change, appetite change, fatigue and unexpected weight change.  HENT: Negative for congestion, mouth sores and sinus pressure.   Eyes: Negative for visual disturbance.  Respiratory: Negative for cough and chest tightness.   Gastrointestinal: Negative for nausea and abdominal pain.  Genitourinary: Negative for frequency, difficulty urinating and vaginal pain.  Musculoskeletal: Negative for back pain and gait problem.  Skin: Negative for pallor and rash.  Neurological: Positive for headaches. Negative for dizziness, tremors, weakness and numbness.  Psychiatric/Behavioral: Positive for dysphoric mood. Negative for suicidal ideas, confusion and sleep disturbance. The patient is nervous/anxious.     Objective:  BP 120/78 mmHg  Pulse 107  Wt 224 lb (101.606 kg)  SpO2 98%  BP Readings from Last 3 Encounters:  05/22/15 120/78  04/09/14 152/104  10/22/13 112/80    Wt Readings from Last 3 Encounters:  05/22/15 224 lb (101.606 kg)  04/09/14 230 lb (104.327 kg)  10/22/13 218 lb (98.884 kg)    Physical Exam  Constitutional: She appears well-developed. No distress.  HENT:  Head: Normocephalic.  Right Ear: External ear normal.  Left Ear: External ear normal.  Nose: Nose normal.  Mouth/Throat: Oropharynx is clear and moist.  Eyes: Conjunctivae are normal. Pupils are equal, round, and reactive to light. Right eye exhibits no discharge. Left eye exhibits no discharge.  Neck: Normal range of motion. Neck supple. No JVD present. No tracheal deviation present. No thyromegaly present.  Cardiovascular: Normal rate, regular rhythm and normal heart sounds.   Pulmonary/Chest: No stridor. No  respiratory distress. She has no wheezes.  Abdominal: Soft. Bowel sounds are normal. She exhibits no distension and no mass. There is no tenderness. There is no rebound and no guarding.  Musculoskeletal: She exhibits no edema or tenderness.  Lymphadenopathy:    She has no cervical adenopathy.    Neurological: She displays normal reflexes. No cranial nerve deficit. She exhibits normal muscle tone. Coordination normal.  Skin: No rash noted. No erythema.  Psychiatric: She has a normal mood and affect. Her behavior is normal. Judgment and thought content normal.  Obese  Lab Results  Component Value Date   WBC 10.1 05/22/2015   HGB 14.3 05/22/2015   HCT 42.1 05/22/2015   PLT 232.0 05/22/2015   GLUCOSE 190* 05/22/2015   CHOL 262* 04/10/2014   TRIG * 04/10/2014    450.0 Triglyceride is over 400; calculations on Lipids are invalid.   HDL 43.20 04/10/2014   LDLDIRECT 135.0 04/10/2014   LDLCALC 133* 01/11/2012   ALT 17 05/22/2015   AST 13 05/22/2015   NA 137 05/22/2015   K 3.5 05/22/2015   CL 102 05/22/2015   CREATININE 1.07 05/22/2015   BUN 25* 05/22/2015   CO2 28 05/22/2015   TSH 41.50* 05/22/2015   INR 0.95 11/25/2009   HGBA1C 8.5* 04/10/2014    Dg Chest 2 View  04/09/2014  CLINICAL DATA:  55 year old female with a history of cough x 1 wk Thx! EXAM: CHEST - 2 VIEW COMPARISON:  11/25/2009 FINDINGS: Cardiomediastinal silhouette projects within normal limits in size and contour. No confluent airspace disease, pneumothorax, or pleural effusion. No displaced fracture. Unremarkable appearance of the upper abdomen. IMPRESSION: No radiographic evidence of acute cardiopulmonary disease. Signed, Dulcy Fanny. Earleen Newport, DO [facility] Radiology Electronically Signed   By: Corrie Mckusick D.O.   On: 04/09/2014 17:16    Assessment & Plan:   Allysah was seen today for migraine.  Diagnoses and all orders for this visit:  Migraine with aura and without status migrainosus, not intractable -     CBC with Differential/Platelet; Future -     Basic metabolic panel; Future -     Vitamin B12; Future -     VITAMIN D 25 Hydroxy (Vit-D Deficiency, Fractures); Future -     TSH; Future -     T3, free; Future -     Urinalysis; Future -     Hepatic  function panel; Future -     Hepatitis C antibody; Future  B12 deficiency -     CBC with Differential/Platelet; Future -     Basic metabolic panel; Future -     Vitamin B12; Future -     VITAMIN D 25 Hydroxy (Vit-D Deficiency, Fractures); Future -     TSH; Future -     T3, free; Future -     Urinalysis; Future -     Hepatic function panel; Future -     Hepatitis C antibody; Future  Hypothyroidism due to acquired atrophy of thyroid -     CBC with Differential/Platelet; Future -     Basic metabolic panel; Future -     Vitamin B12; Future -     VITAMIN D 25 Hydroxy (Vit-D Deficiency, Fractures); Future -     TSH; Future -     T3, free; Future -     Urinalysis; Future -     Hepatic function panel; Future -     Hepatitis C antibody; Future  Thyromegaly  Vitamin D  deficiency  Hyperglycemia  Other orders -     ketorolac (TORADOL) 10 MG tablet; Take 1 tablet (10 mg total) by mouth every 6 (six) hours as needed. -     ondansetron (ZOFRAN) 4 MG tablet; Take 1 tablet (4 mg total) by mouth every 8 (eight) hours as needed for nausea. -     Butalbital-Acetaminophen (BUPAP) 50-300 MG TABS; Take 1 tablet by mouth 2 (two) times daily as needed. -     FLUoxetine (PROZAC) 10 MG tablet; Take 1 tablet (10 mg total) by mouth daily. -     levothyroxine (SYNTHROID) 137 MCG tablet; Take 1 tablet (137 mcg total) by mouth daily before breakfast.  I have discontinued Ms. Alcoser's tacrolimus, topiramate, and promethazine-codeine. I am also having her start on FLUoxetine. Additionally, I am having her maintain her cholecalciferol, Vitamin B-12, DULoxetine, ketorolac, ondansetron, Butalbital-Acetaminophen, and levothyroxine.  Meds ordered this encounter  Medications  . ketorolac (TORADOL) 10 MG tablet    Sig: Take 1 tablet (10 mg total) by mouth every 6 (six) hours as needed.    Dispense:  30 tablet    Refill:  3  . ondansetron (ZOFRAN) 4 MG tablet    Sig: Take 1 tablet (4 mg total) by mouth every 8  (eight) hours as needed for nausea.    Dispense:  30 tablet    Refill:  2  . Butalbital-Acetaminophen (BUPAP) 50-300 MG TABS    Sig: Take 1 tablet by mouth 2 (two) times daily as needed.    Dispense:  60 tablet    Refill:  2  . FLUoxetine (PROZAC) 10 MG tablet    Sig: Take 1 tablet (10 mg total) by mouth daily.    Dispense:  30 tablet    Refill:  5  . levothyroxine (SYNTHROID) 137 MCG tablet    Sig: Take 1 tablet (137 mcg total) by mouth daily before breakfast.    Dispense:  90 tablet    Refill:  3     Follow-up: Return in about 3 months (around 08/22/2015) for a follow-up visit.  Walker Kehr, MD

## 2015-05-22 NOTE — Assessment & Plan Note (Signed)
Labs

## 2015-05-22 NOTE — Assessment & Plan Note (Addendum)
Labs Re-start Vit B12

## 2015-05-22 NOTE — Assessment & Plan Note (Signed)
  Triptan allergic Prozac qd, Fioricet prn, Toradol, Zofran prn

## 2015-05-22 NOTE — Assessment & Plan Note (Signed)
Risks associated with treatment noncompliance were discussed. Compliance was encouraged. You have a low Vitamin D levels - please, start Vit D prescription 50000 iu weekly (Rx emailed to your pharmacy) followed by over-the-counter Vit D 1000 iu daily.

## 2015-05-22 NOTE — Progress Notes (Signed)
Pre visit review using our clinic review tool, if applicable. No additional management support is needed unless otherwise documented below in the visit note. 

## 2015-05-22 NOTE — Assessment & Plan Note (Addendum)
Labs Re-start Levothroid

## 2015-05-22 NOTE — Assessment & Plan Note (Signed)
Monitor [place] periodically

## 2015-05-23 LAB — VITAMIN D 25 HYDROXY (VIT D DEFICIENCY, FRACTURES): VIT D 25 HYDROXY: 19 ng/mL — AB (ref 30–100)

## 2015-06-04 ENCOUNTER — Telehealth: Payer: Self-pay

## 2015-06-04 NOTE — Telephone Encounter (Signed)
FMLA paperwork completed, signed by MD. Copy placed in cabinet for pt pick up, pt advised of same

## 2016-03-03 ENCOUNTER — Encounter: Payer: Self-pay | Admitting: Adult Health

## 2016-03-03 ENCOUNTER — Ambulatory Visit (INDEPENDENT_AMBULATORY_CARE_PROVIDER_SITE_OTHER): Payer: PRIVATE HEALTH INSURANCE | Admitting: Adult Health

## 2016-03-03 VITALS — BP 126/92 | HR 105 | Temp 98.3°F | Wt 226.0 lb

## 2016-03-03 DIAGNOSIS — J0141 Acute recurrent pansinusitis: Secondary | ICD-10-CM

## 2016-03-03 MED ORDER — DOXYCYCLINE HYCLATE 100 MG PO CAPS: 100.0000 mg | ORAL_CAPSULE | Freq: Two times a day (BID) | ORAL | 0 refills | Status: DC

## 2016-03-03 NOTE — Progress Notes (Signed)
Subjective:    Patient ID: Autumn Howard, female    DOB: Feb 08, 1961, 55 y.o.   MRN: [id number]  HPI  55 year old female who presents to the office today with flu like symptoms. She reports that her symptoms include chills, generalized fatigue, non productive cough, sore throat. Her symptoms have been present for 6 days. She has been taking various OTC remedies and was prescribed Tessalon Pearls by UC. At [facility]. She was tested for flu and strep - which she reports was negative.   She has not had any fevers/v/d.    Review of Systems  Constitutional: Positive for activity change, chills and fatigue. Negative for appetite change, diaphoresis, fever and unexpected weight change.  HENT: Positive for ear pain, sinus pressure and sore throat. Negative for sinus pain.   Respiratory: Positive for cough.   Cardiovascular: Negative.   Gastrointestinal: Positive for nausea.  Hematological: Positive for adenopathy.  All other systems reviewed and are negative.  Past Medical History:  Diagnosis Date  . Anxiety   . Depression   . Migraine headache   . Thyroid disease     Social History   Social History  . Marital status: Married    Spouse name: N/A  . Number of children: N/A  . Years of education: N/A   Occupational History  . Not on file.   Social History Main Topics  . Smoking status: Never Smoker  . Smokeless tobacco: Never Used  . Alcohol use No  . Drug use: No  . Sexual activity: Yes   Other Topics Concern  . Not on file   Social History Narrative  . No narrative on file    No past surgical history on file.  Family History  Problem Relation Age of Onset  . Cancer Mother     lungs  . Heart disease Father   . Cancer Father     lung    Allergies  Allergen Reactions  . Imitrex [Sumatriptan] Other (See Comments)    palpitations  . Maxalt [Rizatriptan Benzoate] Other (See Comments)    palpitations  . Vivactil [Protriptyline Hcl]     Near-syncope    Current  Outpatient Prescriptions on File Prior to Visit  Medication Sig Dispense Refill  . Butalbital-Acetaminophen (BUPAP) 50-300 MG TABS Take 1 tablet by mouth 2 (two) times daily as needed. 60 tablet 2  . cholecalciferol (VITAMIN D) 1000 units tablet Take 2 tablets (2,000 Units total) by mouth daily. 100 tablet 5  . Cyanocobalamin (VITAMIN B-12) 1000 MCG SUBL Place 1 tablet (1,000 mcg total) under the tongue daily. 100 tablet 3  . DULoxetine (CYMBALTA) 30 MG capsule Take 1 capsule (30 mg total) by mouth daily. 30 capsule 5  . ergocalciferol (VITAMIN D2) 50000 units capsule Take 1 capsule (50,000 Units total) by mouth once a week. 6 capsule 0  . FLUoxetine (PROZAC) 10 MG tablet Take 1 tablet (10 mg total) by mouth daily. 30 tablet 5  . ketorolac (TORADOL) 10 MG tablet Take 1 tablet (10 mg total) by mouth every 6 (six) hours as needed. 30 tablet 3  . levothyroxine (SYNTHROID) 137 MCG tablet Take 1 tablet (137 mcg total) by mouth daily before breakfast. 90 tablet 3  . ondansetron (ZOFRAN) 4 MG tablet Take 1 tablet (4 mg total) by mouth every 8 (eight) hours as needed for nausea. 30 tablet 2   No current facility-administered medications on file prior to visit.     BP (!) 126/92 (BP Location: Left  Arm, Patient Position: Sitting, Cuff Size: Normal)   Pulse (!) 105   Temp 98.3 F (36.8 C) (Oral)   Wt 226 lb (102.5 kg)   SpO2 98%   BMI 33.37 kg/m       Objective:   Physical Exam  Constitutional: She is oriented to person, place, and time. She appears well-developed and well-nourished. She appears ill. No distress.  HENT:  Head: Normocephalic and atraumatic.  Right Ear: External ear normal.  Left Ear: External ear normal.  Nose: No mucosal edema or rhinorrhea. Right sinus exhibits maxillary sinus tenderness and frontal sinus tenderness.  Mouth/Throat: Posterior oropharyngeal erythema present. No oropharyngeal exudate.  Eyes: Conjunctivae and EOM are normal. Pupils are equal, round, and reactive  to light. Right eye exhibits no discharge. Left eye exhibits no discharge. No scleral icterus.  Neck: Normal range of motion. Neck supple. No thyromegaly present.  Cardiovascular: Normal rate, regular rhythm, normal heart sounds and intact distal pulses.  Exam reveals no gallop and no friction rub.   No murmur heard. Pulmonary/Chest: Effort normal and breath sounds normal. No respiratory distress. She has no wheezes. She has no rales. She exhibits no tenderness.  Musculoskeletal: Normal range of motion. She exhibits no edema, tenderness or deformity.  Lymphadenopathy:    She has cervical adenopathy.  Neurological: She is alert and oriented to person, place, and time.  Skin: Skin is warm and dry. No rash noted. She is not diaphoretic. No erythema. No pallor.  Psychiatric: She has a normal mood and affect. Her behavior is normal. Judgment and thought content normal.  Nursing note and vitals reviewed.     Assessment & Plan:  1. Acute recurrent pansinusitis - Will treat for bacterial sinusitis.  - doxycycline (VIBRAMYCIN) 100 MG capsule; Take 1 capsule (100 mg total) by mouth 2 (two) times daily.  Dispense: 14 capsule; Refill: 0 -Use Flonase and stay hydrated. She was slightly tachycardic this is likely due to dehydration   Dorothyann Peng, NP

## 2016-03-03 NOTE — Progress Notes (Signed)
Pre visit review using our clinic review tool, if applicable. No additional management support is needed unless otherwise documented below in the visit note. 

## 2016-06-21 ENCOUNTER — Encounter: Payer: Self-pay | Admitting: Internal Medicine

## 2016-06-21 ENCOUNTER — Ambulatory Visit (INDEPENDENT_AMBULATORY_CARE_PROVIDER_SITE_OTHER): Payer: PRIVATE HEALTH INSURANCE | Admitting: Internal Medicine

## 2016-06-21 VITALS — BP 124/82 | HR 92 | Temp 98.4°F | Ht 69.0 in | Wt 223.0 lb

## 2016-06-21 DIAGNOSIS — E034 Atrophy of thyroid (acquired): Secondary | ICD-10-CM | POA: Diagnosis not present

## 2016-06-21 DIAGNOSIS — E538 Deficiency of other specified B group vitamins: Secondary | ICD-10-CM

## 2016-06-21 DIAGNOSIS — G43109 Migraine with aura, not intractable, without status migrainosus: Secondary | ICD-10-CM

## 2016-06-21 DIAGNOSIS — Z1211 Encounter for screening for malignant neoplasm of colon: Secondary | ICD-10-CM | POA: Diagnosis not present

## 2016-06-21 DIAGNOSIS — E559 Vitamin D deficiency, unspecified: Secondary | ICD-10-CM

## 2016-06-21 DIAGNOSIS — Z23 Encounter for immunization: Secondary | ICD-10-CM

## 2016-06-21 MED ORDER — VITAMIN D3 50 MCG (2000 UT) PO CAPS: 2000.0000 [IU] | ORAL_CAPSULE | Freq: Every day | ORAL | 3 refills | Status: DC

## 2016-06-21 MED ORDER — KETOROLAC TROMETHAMINE 10 MG PO TABS: 10.0000 mg | ORAL_TABLET | Freq: Four times a day (QID) | ORAL | 3 refills | Status: DC | PRN

## 2016-06-21 MED ORDER — B COMPLEX PO TABS: 1.0000 | ORAL_TABLET | Freq: Every day | ORAL | 3 refills | Status: DC

## 2016-06-21 MED ORDER — ONDANSETRON HCL 4 MG PO TABS: 4.0000 mg | ORAL_TABLET | Freq: Three times a day (TID) | ORAL | 2 refills | Status: DC | PRN

## 2016-06-21 MED ORDER — BUTALBITAL-ACETAMINOPHEN 50-300 MG PO TABS: 1.0000 | ORAL_TABLET | Freq: Two times a day (BID) | ORAL | 2 refills | Status: DC | PRN

## 2016-06-21 NOTE — Progress Notes (Signed)
Pre visit review using our clinic review tool, if applicable. No additional management support is needed unless otherwise documented below in the visit note. 

## 2016-06-21 NOTE — Assessment & Plan Note (Signed)
Re-start B12 

## 2016-06-21 NOTE — Progress Notes (Signed)
Subjective:  Patient ID: Autumn Howard, female    DOB: [birth year]  Age: 56 y.o. MRN: [id number]  CC: No chief complaint on file.   HPI Autumn Howard presents for HAs,  Depression, anxiety. Working a lot, not taking B12, D vitamins, levothroid  Outpatient Medications Prior to Visit  Medication Sig Dispense Refill  . Butalbital-Acetaminophen (BUPAP) 50-300 MG TABS Take 1 tablet by mouth 2 (two) times daily as needed. 60 tablet 2  . ketorolac (TORADOL) 10 MG tablet Take 1 tablet (10 mg total) by mouth every 6 (six) hours as needed. 30 tablet 3  . ondansetron (ZOFRAN) 4 MG tablet Take 1 tablet (4 mg total) by mouth every 8 (eight) hours as needed for nausea. 30 tablet 2  . benzonatate (TESSALON) 200 MG capsule take 1 capsule by mouth three times a day if needed for cough  0  . cholecalciferol (VITAMIN D) 1000 units tablet Take 2 tablets (2,000 Units total) by mouth daily. 100 tablet 5  . Cyanocobalamin (VITAMIN B-12) 1000 MCG SUBL Place 1 tablet (1,000 mcg total) under the tongue daily. 100 tablet 3  . doxycycline (VIBRAMYCIN) 100 MG capsule Take 1 capsule (100 mg total) by mouth 2 (two) times daily. 14 capsule 0  . DULoxetine (CYMBALTA) 30 MG capsule Take 1 capsule (30 mg total) by mouth daily. 30 capsule 5  . ergocalciferol (VITAMIN D2) 50000 units capsule Take 1 capsule (50,000 Units total) by mouth once a week. 6 capsule 0  . FLUoxetine (PROZAC) 10 MG tablet Take 1 tablet (10 mg total) by mouth daily. 30 tablet 5  . levothyroxine (SYNTHROID) 137 MCG tablet Take 1 tablet (137 mcg total) by mouth daily before breakfast. 90 tablet 3   No facility-administered medications prior to visit.     ROS Review of Systems  Constitutional: Negative for activity change, appetite change, chills, fatigue and unexpected weight change.  HENT: Negative for congestion, mouth sores and sinus pressure.   Eyes: Negative for visual disturbance.  Respiratory: Negative for cough and chest tightness.     Gastrointestinal: Negative for abdominal pain and nausea.  Genitourinary: Negative for difficulty urinating, frequency and vaginal pain.  Musculoskeletal: Negative for back pain and gait problem.  Skin: Negative for pallor and rash.  Neurological: Positive for headaches. Negative for dizziness, tremors, weakness and numbness.  Psychiatric/Behavioral: Positive for sleep disturbance. Negative for confusion. The patient is nervous/anxious.     Objective:  BP 124/82 (BP Location: Right Arm, Patient Position: Sitting, Cuff Size: Large)   Pulse 92   Temp 98.4 F (36.9 C) (Oral)   Ht 5\' 9"  (1.753 m)   Wt 223 lb 0.6 oz (101.2 kg)   SpO2 98%   BMI 32.94 kg/m   BP Readings from Last 3 Encounters:  06/21/16 124/82  03/03/16 (!) 126/92  05/22/15 120/78    Wt Readings from Last 3 Encounters:  06/21/16 223 lb 0.6 oz (101.2 kg)  03/03/16 226 lb (102.5 kg)  05/22/15 224 lb (101.6 kg)    Physical Exam  Constitutional: She appears well-developed. No distress.  HENT:  Head: Normocephalic.  Right Ear: External ear normal.  Left Ear: External ear normal.  Nose: Nose normal.  Mouth/Throat: Oropharynx is clear and moist.  Eyes: Conjunctivae are normal. Pupils are equal, round, and reactive to light. Right eye exhibits no discharge. Left eye exhibits no discharge.  Neck: Normal range of motion. Neck supple. No JVD present. No tracheal deviation present. No thyromegaly present.  Cardiovascular: Normal rate,  regular rhythm and normal heart sounds.   Pulmonary/Chest: No stridor. No respiratory distress. She has no wheezes.  Abdominal: Soft. Bowel sounds are normal. She exhibits no distension and no mass. There is no tenderness. There is no rebound and no guarding.  Musculoskeletal: She exhibits no edema or tenderness.  Lymphadenopathy:    She has no cervical adenopathy.  Neurological: She displays normal reflexes. No cranial nerve deficit. She exhibits normal muscle tone. Coordination normal.   Skin: No rash noted. No erythema.  Psychiatric: She has a normal mood and affect. Her behavior is normal. Judgment and thought content normal.    Lab Results  Component Value Date   WBC 10.1 05/22/2015   HGB 14.3 05/22/2015   HCT 42.1 05/22/2015   PLT 232.0 05/22/2015   GLUCOSE 190 (H) 05/22/2015   CHOL 262 (H) 04/10/2014   TRIG (H) 04/10/2014    450.0 Triglyceride is over 400; calculations on Lipids are invalid.   HDL 43.20 04/10/2014   LDLDIRECT 135.0 04/10/2014   LDLCALC 133 (H) 01/11/2012   ALT 17 05/22/2015   AST 13 05/22/2015   NA 137 05/22/2015   K 3.5 05/22/2015   CL 102 05/22/2015   CREATININE 1.07 05/22/2015   BUN 25 (H) 05/22/2015   CO2 28 05/22/2015   TSH 41.50 (H) 05/22/2015   INR 0.95 11/25/2009   HGBA1C 8.5 (H) 04/10/2014    Dg Chest 2 View  Result Date: 04/09/2014 CLINICAL DATA:  56 year old female with a history of cough x 1 wk Thx! EXAM: CHEST - 2 VIEW COMPARISON:  11/25/2009 FINDINGS: Cardiomediastinal silhouette projects within normal limits in size and contour. No confluent airspace disease, pneumothorax, or pleural effusion. No displaced fracture. Unremarkable appearance of the upper abdomen. IMPRESSION: No radiographic evidence of acute cardiopulmonary disease. Signed, Dulcy Fanny. Earleen Newport, DO [facility] Radiology Electronically Signed   By: Corrie Mckusick D.O.   On: 04/09/2014 17:16    Assessment & Plan:   There are no diagnoses linked to this encounter. I have discontinued Autumn Howard's DULoxetine, FLUoxetine, levothyroxine, cholecalciferol, ergocalciferol, Vitamin B-12, benzonatate, and doxycycline. I am also having her maintain her ketorolac, ondansetron, and Butalbital-Acetaminophen.  No orders of the defined types were placed in this encounter.    Follow-up: No Follow-up on file.  Walker Kehr, MD

## 2016-06-21 NOTE — Assessment & Plan Note (Addendum)
meds renewed: Fioricet prn, Toradol, Zofran prn  Potential benefits of a long term Fioricet use as well as potential risks  and complications were explained to the patient and were aknowledged. Labs

## 2016-06-21 NOTE — Assessment & Plan Note (Signed)
FT4, TSH 

## 2016-06-21 NOTE — Assessment & Plan Note (Signed)
Start Vit D again

## 2016-06-22 ENCOUNTER — Other Ambulatory Visit (INDEPENDENT_AMBULATORY_CARE_PROVIDER_SITE_OTHER): Payer: PRIVATE HEALTH INSURANCE

## 2016-06-22 DIAGNOSIS — E538 Deficiency of other specified B group vitamins: Secondary | ICD-10-CM | POA: Diagnosis not present

## 2016-06-22 DIAGNOSIS — Z0289 Encounter for other administrative examinations: Secondary | ICD-10-CM

## 2016-06-22 DIAGNOSIS — E034 Atrophy of thyroid (acquired): Secondary | ICD-10-CM | POA: Diagnosis not present

## 2016-06-22 DIAGNOSIS — E559 Vitamin D deficiency, unspecified: Secondary | ICD-10-CM | POA: Diagnosis not present

## 2016-06-22 LAB — HEPATIC FUNCTION PANEL
ALBUMIN: 4.4 g/dL (ref 3.5–5.2)
ALT: 22 U/L (ref 0–35)
AST: 19 U/L (ref 0–37)
Alkaline Phosphatase: 91 U/L (ref 39–117)
BILIRUBIN TOTAL: 0.6 mg/dL (ref 0.2–1.2)
Bilirubin, Direct: 0.1 mg/dL (ref 0.0–0.3)
Total Protein: 7.5 g/dL (ref 6.0–8.3)

## 2016-06-22 LAB — BASIC METABOLIC PANEL
BUN: 16 mg/dL (ref 6–23)
CHLORIDE: 103 meq/L (ref 96–112)
CO2: 28 mEq/L (ref 19–32)
Calcium: 9.4 mg/dL (ref 8.4–10.5)
Creatinine, Ser: 1.12 mg/dL (ref 0.40–1.20)
GFR: 53.36 mL/min — AB (ref >60.00)
GLUCOSE: 300 mg/dL — AB (ref 70–99)
Potassium: 4.4 mEq/L (ref 3.5–5.1)
SODIUM: 138 meq/L (ref 135–145)

## 2016-06-22 LAB — URINALYSIS, ROUTINE W REFLEX MICROSCOPIC
Bilirubin Urine: NEGATIVE
Hgb urine dipstick: NEGATIVE
Ketones, ur: NEGATIVE
Nitrite: NEGATIVE
PH: 6 (ref 5.0–8.0)
RBC / HPF: NONE SEEN (ref 0–?)
Specific Gravity, Urine: 1.02 (ref 1.000–1.030)
UROBILINOGEN UA: 0.2 (ref 0.0–1.0)
Urine Glucose: 250 — AB

## 2016-06-22 LAB — CBC WITH DIFFERENTIAL/PLATELET
Basophils Absolute: 0.1 10*3/uL (ref 0.0–0.1)
Basophils Relative: 1.4 % (ref 0.0–3.0)
EOS PCT: 3.2 % (ref 0.0–5.0)
Eosinophils Absolute: 0.2 10*3/uL (ref 0.0–0.7)
HCT: 43.7 % (ref 36.0–46.0)
Hemoglobin: 15.3 g/dL — ABNORMAL HIGH (ref 12.0–15.0)
Lymphocytes Relative: 41.2 % (ref 12.0–46.0)
Lymphs Abs: 2.6 10*3/uL (ref 0.7–4.0)
MCHC: 35.1 g/dL (ref 30.0–36.0)
MCV: 90.1 fl (ref 78.0–100.0)
MONOS PCT: 5.3 % (ref 3.0–12.0)
Monocytes Absolute: 0.3 10*3/uL (ref 0.1–1.0)
Neutro Abs: 3 10*3/uL (ref 1.4–7.7)
Neutrophils Relative %: 48.9 % (ref 43.0–77.0)
Platelets: 182 10*3/uL (ref 150.0–400.0)
RBC: 4.84 Mil/uL (ref 3.87–5.11)
RDW: 14.5 % (ref 11.5–15.5)
WBC: 6.2 10*3/uL (ref 4.0–10.5)

## 2016-06-22 LAB — LIPID PANEL
CHOL/HDL RATIO: 7
CHOLESTEROL: 252 mg/dL — AB (ref 0–200)
HDL: 38 mg/dL — ABNORMAL LOW (ref >39.00)
NonHDL: 214.46
TRIGLYCERIDES: 340 mg/dL — AB (ref 0.0–149.0)
VLDL: 68 mg/dL — ABNORMAL HIGH (ref 0.0–40.0)

## 2016-06-22 LAB — VITAMIN D 25 HYDROXY (VIT D DEFICIENCY, FRACTURES): VITD: 22.93 ng/mL — AB (ref 30.00–100.00)

## 2016-06-22 LAB — VITAMIN B12: VITAMIN B 12: 264 pg/mL (ref 211–911)

## 2016-06-22 LAB — LDL CHOLESTEROL, DIRECT: Direct LDL: 135 mg/dL

## 2016-06-22 LAB — TSH: TSH: 57.43 u[IU]/mL — ABNORMAL HIGH (ref 0.35–4.50)

## 2016-06-23 ENCOUNTER — Other Ambulatory Visit: Payer: Self-pay | Admitting: Internal Medicine

## 2016-06-23 LAB — T4, FREE: FREE T4: 0.24 ng/dL — AB (ref 0.60–1.60)

## 2016-06-23 MED ORDER — LEVOTHYROXINE SODIUM 100 MCG PO TABS: 100.0000 ug | ORAL_TABLET | Freq: Every day | ORAL | 11 refills | Status: DC

## 2016-06-23 MED ORDER — VITAMIN D3 1.25 MG (50000 UT) PO CAPS: 1.0000 | ORAL_CAPSULE | ORAL | 0 refills | Status: DC

## 2016-06-23 MED ORDER — METFORMIN HCL 500 MG PO TABS: 500.0000 mg | ORAL_TABLET | Freq: Every day | ORAL | 11 refills | Status: DC

## 2016-06-23 MED ORDER — VITAMIN B-12 1000 MCG SL SUBL: 1.0000 | SUBLINGUAL_TABLET | Freq: Every day | SUBLINGUAL | 3 refills | Status: DC

## 2016-07-12 ENCOUNTER — Encounter: Payer: Self-pay | Admitting: Internal Medicine

## 2016-07-12 ENCOUNTER — Ambulatory Visit (INDEPENDENT_AMBULATORY_CARE_PROVIDER_SITE_OTHER): Payer: PRIVATE HEALTH INSURANCE | Admitting: Internal Medicine

## 2016-07-12 DIAGNOSIS — E559 Vitamin D deficiency, unspecified: Secondary | ICD-10-CM | POA: Diagnosis not present

## 2016-07-12 DIAGNOSIS — E1169 Type 2 diabetes mellitus with other specified complication: Secondary | ICD-10-CM | POA: Diagnosis not present

## 2016-07-12 DIAGNOSIS — E034 Atrophy of thyroid (acquired): Secondary | ICD-10-CM | POA: Diagnosis not present

## 2016-07-12 DIAGNOSIS — E669 Obesity, unspecified: Secondary | ICD-10-CM | POA: Diagnosis not present

## 2016-07-12 DIAGNOSIS — E538 Deficiency of other specified B group vitamins: Secondary | ICD-10-CM | POA: Diagnosis not present

## 2016-07-12 LAB — POCT GLYCOSYLATED HEMOGLOBIN (HGB A1C): Hemoglobin A1C: 9.1

## 2016-07-12 MED ORDER — CYANOCOBALAMIN 1000 MCG/ML IJ SOLN
1000.0000 ug | Freq: Once | INTRAMUSCULAR | Status: AC
  Administered 2016-07-12: 1000 ug via INTRAMUSCULAR

## 2016-07-12 MED ORDER — METFORMIN HCL 500 MG PO TABS: 500.0000 mg | ORAL_TABLET | Freq: Two times a day (BID) | ORAL | 11 refills | Status: DC

## 2016-07-12 NOTE — Addendum Note (Signed)
Addended by: Karren Cobble on: 07/12/2016 11:38 AM   Modules accepted: Orders

## 2016-07-12 NOTE — Assessment & Plan Note (Addendum)
Increase metformin to bid A1c>9

## 2016-07-12 NOTE — Progress Notes (Signed)
Subjective:  Patient ID: Autumn Howard, female    DOB: [birth year]  Age: 56 y.o. MRN: [id number]  CC: No chief complaint on file.   HPI Autumn Howard presents for new DM, Vit B 12 def, Vit D def f/u  Outpatient Medications Prior to Visit  Medication Sig Dispense Refill  . Butalbital-Acetaminophen (BUPAP) 50-300 MG TABS Take 1 tablet by mouth 2 (two) times daily as needed. 60 tablet 2  . Cholecalciferol (VITAMIN D3) 2000 units capsule Take 1 capsule (2,000 Units total) by mouth daily. 100 capsule 3  . Cholecalciferol (VITAMIN D3) 50000 units CAPS Take 1 capsule by mouth once a week. 6 capsule 0  . Cyanocobalamin (VITAMIN B-12) 1000 MCG SUBL Place 1 tablet (1,000 mcg total) under the tongue daily. 100 tablet 3  . ketorolac (TORADOL) 10 MG tablet Take 1 tablet (10 mg total) by mouth every 6 (six) hours as needed. 30 tablet 3  . levothyroxine (SYNTHROID, LEVOTHROID) 100 MCG tablet Take 1 tablet (100 mcg total) by mouth daily. 30 tablet 11  . metFORMIN (GLUCOPHAGE) 500 MG tablet Take 1 tablet (500 mg total) by mouth daily with breakfast. 30 tablet 11  . ondansetron (ZOFRAN) 4 MG tablet Take 1 tablet (4 mg total) by mouth every 8 (eight) hours as needed for nausea. 30 tablet 2   No facility-administered medications prior to visit.     ROS Review of Systems  Constitutional: Positive for fatigue. Negative for activity change, appetite change, chills and unexpected weight change.  HENT: Negative for congestion, mouth sores and sinus pressure.   Eyes: Negative for visual disturbance.  Respiratory: Negative for cough and chest tightness.   Gastrointestinal: Negative for abdominal pain and nausea.  Genitourinary: Negative for difficulty urinating, frequency and vaginal pain.  Musculoskeletal: Negative for back pain and gait problem.  Skin: Negative for pallor and rash.  Neurological: Negative for dizziness, tremors, weakness, numbness and headaches.  Psychiatric/Behavioral: Negative for  confusion and sleep disturbance.    Objective:  BP 120/80 (BP Location: Left Arm, Patient Position: Sitting, Cuff Size: Large)   Pulse 75   Temp 97.6 F (36.4 C) (Oral)   Ht 5\' 9"  (1.753 m)   Wt 210 lb 1.9 oz (95.3 kg)   SpO2 100%   BMI 31.03 kg/m   BP Readings from Last 3 Encounters:  07/12/16 120/80  06/21/16 124/82  03/03/16 (!) 126/92    Wt Readings from Last 3 Encounters:  07/12/16 210 lb 1.9 oz (95.3 kg)  06/21/16 223 lb 0.6 oz (101.2 kg)  03/03/16 226 lb (102.5 kg)    Physical Exam  Constitutional: She appears well-developed. No distress.  HENT:  Head: Normocephalic.  Right Ear: External ear normal.  Left Ear: External ear normal.  Nose: Nose normal.  Mouth/Throat: Oropharynx is clear and moist.  Eyes: Conjunctivae are normal. Pupils are equal, round, and reactive to light. Right eye exhibits no discharge. Left eye exhibits no discharge.  Neck: Normal range of motion. Neck supple. No JVD present. No tracheal deviation present. No thyromegaly present.  Cardiovascular: Normal rate, regular rhythm and normal heart sounds.   Pulmonary/Chest: No stridor. No respiratory distress. She has no wheezes.  Abdominal: Soft. Bowel sounds are normal. She exhibits no distension and no mass. There is no tenderness. There is no rebound and no guarding.  Musculoskeletal: She exhibits no edema or tenderness.  Lymphadenopathy:    She has no cervical adenopathy.  Neurological: She displays normal reflexes. No cranial nerve deficit. She exhibits  normal muscle tone. Coordination normal.  Skin: No rash noted. No erythema.  Psychiatric: She has a normal mood and affect. Her behavior is normal. Judgment and thought content normal.  obese   Lab Results  Component Value Date   WBC 6.2 06/22/2016   HGB 15.3 (H) 06/22/2016   HCT 43.7 06/22/2016   PLT 182.0 06/22/2016   GLUCOSE 300 (H) 06/22/2016   CHOL 252 (H) 06/22/2016   TRIG 340.0 (H) 06/22/2016   HDL 38.00 (L) 06/22/2016    LDLDIRECT 135.0 06/22/2016   LDLCALC 133 (H) 01/11/2012   ALT 22 06/22/2016   AST 19 06/22/2016   NA 138 06/22/2016   K 4.4 06/22/2016   CL 103 06/22/2016   CREATININE 1.12 06/22/2016   BUN 16 06/22/2016   CO2 28 06/22/2016   TSH 57.43 Auto-dilution (H) 06/22/2016   INR 0.95 11/25/2009   HGBA1C 8.5 (H) 04/10/2014    Dg Chest 2 View  Result Date: 04/09/2014 CLINICAL DATA:  56 year old female with a history of cough x 1 wk Thx! EXAM: CHEST - 2 VIEW COMPARISON:  11/25/2009 FINDINGS: Cardiomediastinal silhouette projects within normal limits in size and contour. No confluent airspace disease, pneumothorax, or pleural effusion. No displaced fracture. Unremarkable appearance of the upper abdomen. IMPRESSION: No radiographic evidence of acute cardiopulmonary disease. Signed, Dulcy Fanny. Earleen Newport, DO [facility] Radiology Electronically Signed   By: Corrie Mckusick D.O.   On: 04/09/2014 17:16    Assessment & Plan:   There are no diagnoses linked to this encounter. I am having Ms. Bowersox maintain her Butalbital-Acetaminophen, ketorolac, ondansetron, Vitamin D3, levothyroxine, Vitamin D3, metFORMIN, and Vitamin B-12.  No orders of the defined types were placed in this encounter.    Follow-up: No Follow-up on file.  Walker Kehr, MD

## 2016-07-12 NOTE — Progress Notes (Signed)
Pre visit review using our clinic review tool, if applicable. No additional management support is needed unless otherwise documented below in the visit note. 

## 2016-07-12 NOTE — Assessment & Plan Note (Signed)
Labs

## 2016-07-12 NOTE — Assessment & Plan Note (Signed)
On Vit D 

## 2016-07-12 NOTE — Assessment & Plan Note (Signed)
On B12 

## 2016-07-19 ENCOUNTER — Encounter: Payer: Self-pay | Admitting: Nurse Practitioner

## 2016-07-19 ENCOUNTER — Ambulatory Visit (INDEPENDENT_AMBULATORY_CARE_PROVIDER_SITE_OTHER): Payer: PRIVATE HEALTH INSURANCE | Admitting: Nurse Practitioner

## 2016-07-19 ENCOUNTER — Other Ambulatory Visit (INDEPENDENT_AMBULATORY_CARE_PROVIDER_SITE_OTHER): Payer: PRIVATE HEALTH INSURANCE

## 2016-07-19 VITALS — BP 106/84 | HR 103 | Temp 98.8°F | Ht 69.0 in | Wt 206.0 lb

## 2016-07-19 DIAGNOSIS — R6883 Chills (without fever): Secondary | ICD-10-CM

## 2016-07-19 DIAGNOSIS — R5381 Other malaise: Secondary | ICD-10-CM

## 2016-07-19 DIAGNOSIS — R1013 Epigastric pain: Secondary | ICD-10-CM

## 2016-07-19 LAB — POCT URINALYSIS DIPSTICK
BILIRUBIN UA: NEGATIVE
Blood, UA: NEGATIVE
Glucose, UA: NEGATIVE
NITRITE UA: NEGATIVE
PROTEIN UA: NEGATIVE
Spec Grav, UA: 1.025 (ref 1.010–1.025)
UROBILINOGEN UA: 0.2 U/dL
pH, UA: 6 (ref 5.0–8.0)

## 2016-07-19 LAB — CBC WITH DIFFERENTIAL/PLATELET
BASOS PCT: 0.6 % (ref 0.0–3.0)
Basophils Absolute: 0 10*3/uL (ref 0.0–0.1)
EOS ABS: 0 10*3/uL (ref 0.0–0.7)
EOS PCT: 0.4 % (ref 0.0–5.0)
HEMATOCRIT: 44.9 % (ref 36.0–46.0)
HEMOGLOBIN: 15.3 g/dL — AB (ref 12.0–15.0)
Lymphocytes Relative: 9.7 % — ABNORMAL LOW (ref 12.0–46.0)
Lymphs Abs: 0.6 10*3/uL — ABNORMAL LOW (ref 0.7–4.0)
MCHC: 34.1 g/dL (ref 30.0–36.0)
MCV: 90 fl (ref 78.0–100.0)
MONO ABS: 0.4 10*3/uL (ref 0.1–1.0)
Monocytes Relative: 6.7 % (ref 3.0–12.0)
NEUTROS ABS: 5.1 10*3/uL (ref 1.4–7.7)
Neutrophils Relative %: 82.6 % — ABNORMAL HIGH (ref 43.0–77.0)
PLATELETS: 172 10*3/uL (ref 150.0–400.0)
RBC: 4.98 Mil/uL (ref 3.87–5.11)
RDW: 14.4 % (ref 11.5–15.5)
WBC: 6.1 10*3/uL (ref 4.0–10.5)

## 2016-07-19 LAB — HEPATIC FUNCTION PANEL
ALT: 31 U/L (ref 0–35)
AST: 34 U/L (ref 0–37)
Albumin: 4.7 g/dL (ref 3.5–5.2)
Alkaline Phosphatase: 83 U/L (ref 39–117)
BILIRUBIN TOTAL: 1 mg/dL (ref 0.2–1.2)
Bilirubin, Direct: 0.2 mg/dL (ref 0.0–0.3)
Total Protein: 7.9 g/dL (ref 6.0–8.3)

## 2016-07-19 LAB — LIPASE: LIPASE: 3 U/L — AB (ref 11.0–59.0)

## 2016-07-19 MED ORDER — BISMUTH SUBSALICYLATE 262 MG/15ML PO SUSP: 30.0000 mL | ORAL | 0 refills | Status: DC | PRN

## 2016-07-19 NOTE — Progress Notes (Signed)
Subjective:  Patient ID: Autumn Howard, female    DOB: Sep 07, 1960  Age: 56 y.o. MRN: [id number]  CC: Abdominal Pain (upper stomach pain,nausea,vomit,body pain (head hurt) 1 day ago after dinner. --cant keep anything down today--didnt take meds)   Abdominal Pain  This is a new problem. The current episode started yesterday. The onset quality is sudden. The problem occurs constantly. The problem has been unchanged. The pain is located in the epigastric region. The quality of the pain is colicky and a sensation of fullness. The abdominal pain does not radiate. Associated symptoms include anorexia, a fever, headaches, myalgias, nausea and vomiting. Pertinent negatives include no belching, constipation, diarrhea, dysuria, flatus, frequency, hematochezia, hematuria, melena or weight loss. The pain is aggravated by movement. The pain is relieved by recumbency. She has tried nothing for the symptoms. Her past medical history is significant for GERD.    Outpatient Medications Prior to Visit  Medication Sig Dispense Refill  . Butalbital-Acetaminophen (BUPAP) 50-300 MG TABS Take 1 tablet by mouth 2 (two) times daily as needed. 60 tablet 2  . Cholecalciferol (VITAMIN D3) 50000 units CAPS Take 1 capsule by mouth once a week. 6 capsule 0  . Cyanocobalamin (VITAMIN B-12) 1000 MCG SUBL Place 1 tablet (1,000 mcg total) under the tongue daily. 100 tablet 3  . ketorolac (TORADOL) 10 MG tablet Take 1 tablet (10 mg total) by mouth every 6 (six) hours as needed. 30 tablet 3  . levothyroxine (SYNTHROID, LEVOTHROID) 100 MCG tablet Take 1 tablet (100 mcg total) by mouth daily. 30 tablet 11  . metFORMIN (GLUCOPHAGE) 500 MG tablet Take 1 tablet (500 mg total) by mouth 2 (two) times daily with a meal. 60 tablet 11  . ondansetron (ZOFRAN) 4 MG tablet Take 1 tablet (4 mg total) by mouth every 8 (eight) hours as needed for nausea. 30 tablet 2  . Cholecalciferol (VITAMIN D3) 2000 units capsule Take 1 capsule (2,000 Units  total) by mouth daily. (Patient not taking: Reported on 07/19/2016) 100 capsule 3   No facility-administered medications prior to visit.     ROS See HPI  Objective:  BP 106/84   Pulse (!) 103   Temp 98.8 F (37.1 C)   Ht 5\' 9"  (1.753 m)   Wt 206 lb (93.4 kg)   SpO2 97%   BMI 30.42 kg/m   BP Readings from Last 3 Encounters:  07/19/16 106/84  07/12/16 120/80  06/21/16 124/82    Wt Readings from Last 3 Encounters:  07/19/16 206 lb (93.4 kg)  07/12/16 210 lb 1.9 oz (95.3 kg)  06/21/16 223 lb 0.6 oz (101.2 kg)    Physical Exam  Constitutional: She is oriented to person, place, and time. No distress.  Eyes: No scleral icterus.  Neck: Normal range of motion. Neck supple.  Cardiovascular: Normal rate and normal heart sounds.   Pulmonary/Chest: Effort normal and breath sounds normal.  Abdominal: Soft. Bowel sounds are normal. She exhibits no distension and no mass. There is tenderness. There is no rebound and no guarding.  Epigastric tenderness  Lymphadenopathy:    She has no cervical adenopathy.  Neurological: She is alert and oriented to person, place, and time.  Vitals reviewed.   Lab Results  Component Value Date   WBC 6.1 07/19/2016   HGB 15.3 (H) 07/19/2016   HCT 44.9 07/19/2016   PLT 172.0 07/19/2016   GLUCOSE 300 (H) 06/22/2016   CHOL 252 (H) 06/22/2016   TRIG 340.0 (H) 06/22/2016   HDL 38.00 (  L) 06/22/2016   LDLDIRECT 135.0 06/22/2016   LDLCALC 133 (H) 01/11/2012   ALT 31 07/19/2016   AST 34 07/19/2016   NA 138 06/22/2016   K 4.4 06/22/2016   CL 103 06/22/2016   CREATININE 1.12 06/22/2016   BUN 16 06/22/2016   CO2 28 06/22/2016   TSH 57.43 Auto-dilution (H) 06/22/2016   INR 0.95 11/25/2009   HGBA1C 9.1 07/12/2016    Dg Chest 2 View  Result Date: 04/09/2014 CLINICAL DATA:  56 year old female with a history of cough x 1 wk Thx! EXAM: CHEST - 2 VIEW COMPARISON:  11/25/2009 FINDINGS: Cardiomediastinal silhouette projects within normal limits in size  and contour. No confluent airspace disease, pneumothorax, or pleural effusion. No displaced fracture. Unremarkable appearance of the upper abdomen. IMPRESSION: No radiographic evidence of acute cardiopulmonary disease. Signed, Dulcy Fanny. Earleen Newport, DO [facility] Radiology Electronically Signed   By: Corrie Mckusick D.O.   On: 04/09/2014 17:16    Assessment & Plan:   Autumn was seen today for abdominal pain.  Diagnoses and all orders for this visit:  Acute epigastric pain -     bismuth subsalicylate (PEPTO-BISMOL) 262 MG/15ML suspension; Take 30 mLs by mouth every 4 (four) hours as needed for indigestion. -     Lipase; Future -     Hepatic function panel; Future  Malaise -     POCT urinalysis dipstick -     CBC w/Diff; Future -     Lipase; Future -     Hepatic function panel; Future  Chills without fever -     POCT urinalysis dipstick -     CBC w/Diff; Future -     Lipase; Future -     Hepatic function panel; Future   I am having Autumn Howard start on bismuth subsalicylate. I am also having her maintain her Butalbital-Acetaminophen, ketorolac, ondansetron, Vitamin D3, levothyroxine, Vitamin D3, Vitamin B-12, and metFORMIN.  Meds ordered this encounter  Medications  . bismuth subsalicylate (PEPTO-BISMOL) 262 MG/15ML suspension    Sig: Take 30 mLs by mouth every 4 (four) hours as needed for indigestion.    Dispense:  360 mL    Refill:  0    Order Specific Question:   Supervising Provider    Answer:   Cassandria Anger [[id number]]    Follow-up: Return if symptoms worsen or fail to improve.  Wilfred Lacy, NP

## 2016-07-19 NOTE — Patient Instructions (Addendum)
Hold metformin and vitamins till about to eat solid food.  Go to basement for blood draw. You will be called with results.  Clear Liquid Diet A clear liquid diet means that you only have liquids that you can see through. You do not eat any food on this diet. Most people need to follow this diet for only a short time. What do I need to know about this diet?  A clear liquid is a liquid that you can see through when you hold it up to a light.  This diet does not give you all the nutrients that you need. Choose a variety of the liquids that your doctor says you can drink on this diet. That way, you will get as many nutrients as possible.  If you are not sure whether you can have certain items, ask your doctor. What can I have?  Water and flavored water.  Fruit juices that do not have pulp, such as cranberry juice and apple juice.  Tea and coffee without milk or cream.  Clear bouillon or broth.  Broth-based soups that have been strained.  Flavored gelatins.  Honey.  Sugar water.  Frozen ice or frozen ice pops that do not have any milk, yogurt, fruit pieces, or fruit pulp in them.  Clear sodas.  Clear sports drinks. The items listed above may not be a complete list of recommended liquids. Contact your food and nutrition expert (dietitian) for more options.  What can I not have?  Juices that have pulp.  Milk.  Cream or cream-based soups.  Yogurt. The items listed above may not be a complete list of liquids to avoid. Contact your food and nutrition expert for more information.  Summary  A clear liquid diet is a diet that includes only liquids that you can see through.  The goal of this diet is to help you recover.  Make sure to avoid liquids with milk, cream, or pulp while you are on this diet. This information is not intended to replace advice given to you by your health care provider. Make sure you discuss any questions you have with your health care  provider. Document Released: 02/05/2008 Document Revised: 10/06/2015 Document Reviewed: 01/19/2013 Elsevier Interactive Patient Education  2017 [employer] Inc[employer]   Dehydration, Adult Dehydration is when there is not enough fluid or water in your body. This happens when you lose more fluids than you take in. Dehydration can range from mild to very bad. It should be treated right away to keep it from getting very bad. Symptoms of mild dehydration may include:   Thirst.  Dry lips.  Slightly dry mouth.  Dry, warm skin.  Dizziness. Symptoms of moderate dehydration may include:   Very dry mouth.  Muscle cramps.  Dark pee (urine). Pee may be the color of tea.  Your body making less pee.  Your eyes making fewer tears.  Heartbeat that is uneven or faster than normal (palpitations).  Headache.  Light-headedness, especially when you stand up from sitting.  Fainting (syncope). Symptoms of very bad dehydration may include:   Changes in skin, such as:  Cold and clammy skin.  Blotchy (mottled) or pale skin.  Skin that does not quickly return to normal after being lightly pinched and let go (poor skin turgor).  Changes in body fluids, such as:  Feeling very thirsty.  Your eyes making fewer tears.  Not sweating when body temperature is high, such as in hot weather.  Your body making very little pee.  Changes in vital signs, such as:  Weak pulse.  Pulse that is more than 100 beats a minute when you are sitting still.  Fast breathing.  Low blood pressure.  Other changes, such as:  Sunken eyes.  Cold hands and feet.  Confusion.  Lack of energy (lethargy).  Trouble waking up from sleep.  Short-term weight loss.  Unconsciousness. Follow these instructions at home:  If told by your doctor, drink an ORS:  Make an ORS by using instructions on the package.  Start by drinking small amounts, about  cup (120 mL) every 5-10 minutes.  Slowly drink more until  you have had the amount that your doctor said to have.  Drink enough clear fluid to keep your pee clear or pale yellow. If you were told to drink an ORS, finish the ORS first, then start slowly drinking clear fluids. Drink fluids such as:  Water. Do not drink only water by itself. Doing that can make the salt (sodium) level in your body get too low (hyponatremia).  Ice chips.  Fruit juice that you have added water to (diluted).  Low-calorie sports drinks.  Avoid:  Alcohol.  Drinks that have a lot of sugar. These include high-calorie sports drinks, fruit juice that does not have water added, and soda.  Caffeine.  Foods that are greasy or have a lot of fat or sugar.  Take over-the-counter and prescription medicines only as told by your doctor.  Do not take salt tablets. Doing that can make the salt level in your body get too high (hypernatremia).  Eat foods that have minerals (electrolytes). Examples include bananas, oranges, potatoes, tomatoes, and spinach.  Keep all follow-up visits as told by your doctor. This is important. Contact a doctor if:  You have belly (abdominal) pain that:  Gets worse.  Stays in one area (localizes).  You have a rash.  You have a stiff neck.  You get angry or annoyed more easily than normal (irritability).  You are more sleepy than normal.  You have a harder time waking up than normal.  You feel:  Weak.  Dizzy.  Very thirsty.  You have peed (urinated) only a small amount of very dark pee during 6-8 hours. Get help right away if:  You have symptoms of very bad dehydration.  You cannot drink fluids without throwing up (vomiting).  Your symptoms get worse with treatment.  You have a fever.  You have a very bad headache.  You are throwing up or having watery poop (diarrhea) and it:  Gets worse.  Does not go away.  You have blood or something green (bile) in your throw-up.  You have blood in your poop (stool). This may  cause poop to look black and tarry.  You have not peed in 6-8 hours.  You pass out (faint).  Your heart rate when you are sitting still is more than 100 beats a minute.  You have trouble breathing. This information is not intended to replace advice given to you by your health care provider. Make sure you discuss any questions you have with your health care provider. Document Released: 12/19/2008 Document Revised: 09/12/2015 Document Reviewed: 04/18/2015 Elsevier Interactive Patient Education  2017 [employer].

## 2016-08-03 ENCOUNTER — Encounter: Payer: Self-pay | Admitting: Internal Medicine

## 2016-08-18 ENCOUNTER — Telehealth: Payer: Self-pay | Admitting: *Deleted

## 2016-08-18 MED ORDER — METFORMIN HCL 500 MG PO TABS: 500.0000 mg | ORAL_TABLET | Freq: Two times a day (BID) | ORAL | 11 refills | Status: DC

## 2016-08-18 NOTE — Telephone Encounter (Signed)
Rec'd call pt states MD increase her metformin to two times a day pharmacy is needing updated script. Verified chart new rx was entered was not sent. Sent electronically to pof...Johny Chess

## 2016-08-27 ENCOUNTER — Encounter: Payer: Self-pay | Admitting: Gastroenterology

## 2016-10-21 ENCOUNTER — Ambulatory Visit (AMBULATORY_SURGERY_CENTER): Payer: Self-pay | Admitting: *Deleted

## 2016-10-21 VITALS — Ht 69.0 in | Wt 197.6 lb

## 2016-10-21 DIAGNOSIS — Z1211 Encounter for screening for malignant neoplasm of colon: Secondary | ICD-10-CM

## 2016-10-21 MED ORDER — NA SULFATE-K SULFATE-MG SULF 17.5-3.13-1.6 GM/177ML PO SOLN: 1.0000 | Freq: Once | ORAL | 0 refills | Status: AC

## 2016-10-21 NOTE — Progress Notes (Signed)
No egg or soy allergy known to patient   issues with past sedation with any surgeries  or procedures of PONV , no intubation problems  No diet pills per patient No home 02 use per patient  No blood thinners per patient  Pt denies issues with constipation  No A fib or A flutter  EMMI video sent to pt's e mail - pt declined   

## 2016-10-25 ENCOUNTER — Encounter: Payer: Self-pay | Admitting: Gastroenterology

## 2016-11-03 ENCOUNTER — Ambulatory Visit (AMBULATORY_SURGERY_CENTER): Payer: PRIVATE HEALTH INSURANCE | Admitting: Gastroenterology

## 2016-11-03 ENCOUNTER — Encounter: Payer: Self-pay | Admitting: Gastroenterology

## 2016-11-03 VITALS — BP 109/53 | HR 75 | Temp 98.0°F | Resp 13 | Ht 69.0 in | Wt 197.0 lb

## 2016-11-03 DIAGNOSIS — K635 Polyp of colon: Secondary | ICD-10-CM

## 2016-11-03 DIAGNOSIS — Z1211 Encounter for screening for malignant neoplasm of colon: Secondary | ICD-10-CM

## 2016-11-03 DIAGNOSIS — D123 Benign neoplasm of transverse colon: Secondary | ICD-10-CM

## 2016-11-03 MED ORDER — SODIUM CHLORIDE 0.9 % IV SOLN: 500.0000 mL | INTRAVENOUS | Status: DC

## 2016-11-03 NOTE — Patient Instructions (Signed)
YOU HAD AN ENDOSCOPIC PROCEDURE TODAY AT THE [facility]:   Refer to the procedure report that was given to you for any specific questions about what was found during the examination.  If the procedure report does not answer your questions, please call your gastroenterologist to clarify.  If you requested that your care partner not be given the details of your procedure findings, then the procedure report has been included in a sealed envelope for you to review at your convenience later.  YOU SHOULD EXPECT: Some feelings of bloating in the abdomen. Passage of more gas than usual.  Walking can help get rid of the air that was put into your GI tract during the procedure and reduce the bloating. If you had a lower endoscopy (such as a colonoscopy or flexible sigmoidoscopy) you may notice spotting of blood in your stool or on the toilet paper. If you underwent a bowel prep for your procedure, you may not have a normal bowel movement for a few days.  Please Note:  You might notice some irritation and congestion in your nose or some drainage.  This is from the oxygen used during your procedure.  There is no need for concern and it should clear up in a day or so.  SYMPTOMS TO REPORT IMMEDIATELY:   Following lower endoscopy (colonoscopy or flexible sigmoidoscopy):  Excessive amounts of blood in the stool  Significant tenderness or worsening of abdominal pains  Swelling of the abdomen that is new, acute  Fever of 100F or higher  For urgent or emergent issues, a gastroenterologist can be reached at any hour by calling [phone].   DIET:  We do recommend a small meal at first, but then you may proceed to your regular diet.  Drink plenty of fluids but you should avoid alcoholic beverages for 24 hours.  ACTIVITY:  You should plan to take it easy for the rest of today and you should NOT DRIVE or use heavy machinery until tomorrow (because of the sedation medicines used during the test).     FOLLOW UP: Our staff will call the number listed on your records the next business day following your procedure to check on you and address any questions or concerns that you may have regarding the information given to you following your procedure. If we do not reach you, we will leave a message.  However, if you are feeling well and you are not experiencing any problems, there is no need to return our call.  We will assume that you have returned to your regular daily activities without incident.  If any biopsies were taken you will be contacted by phone or by letter within the next 1-3 weeks.  Please call us at [phone] if you have not heard about the biopsies in 3 weeks.   Await for biopsy results to determine next repeat Colonoscopy screening Polyps (handout given) Hemorrhoids (handout given)   SIGNATURES/CONFIDENTIALITY: You and/or your care partner have signed paperwork which will be entered into your electronic medical record.  These signatures attest to the fact that that the information above on your After Visit Summary has been reviewed and is understood.  Full responsibility of the confidentiality of this discharge information lies with you and/or your care-partner. 

## 2016-11-03 NOTE — Progress Notes (Signed)
A and O x3. Report to RN. Tolerated MAC anesthesia well.

## 2016-11-03 NOTE — Progress Notes (Signed)
Called to room to assist during endoscopic procedure.  Patient ID and intended procedure confirmed with present staff. Received instructions for my participation in the procedure from the performing physician.  

## 2016-11-03 NOTE — Op Note (Signed)
[name] Patient Name: Autumn Howard Procedure Date: 11/03/2016 1:30 PM MRN: [id number] Endoscopist: Ladene Artist , MD Age: 56 Referring MD:  Date of Birth: [birth year] Gender: Female Account #: [id number] Procedure:                Colonoscopy Indications:              Screening for colorectal malignant neoplasm Medicines:                Monitored Anesthesia Care Procedure:                Pre-Anesthesia Assessment:                           - Prior to the procedure, a History and Physical                            was performed, and patient medications and                            allergies were reviewed. The patient's tolerance of                            previous anesthesia was also reviewed. The risks                            and benefits of the procedure and the sedation                            options and risks were discussed with the patient.                            All questions were answered, and informed consent                            was obtained. Prior Anticoagulants: The patient has                            taken no previous anticoagulant or antiplatelet                            agents. ASA Grade Assessment: II - A patient with                            mild systemic disease. After reviewing the risks                            and benefits, the patient was deemed in                            satisfactory condition to undergo the procedure.                           After obtaining informed consent, the colonoscope  was passed under direct vision. Throughout the                            procedure, the patient's blood pressure, pulse, and                            oxygen saturations were monitored continuously. The                            Colonoscope was introduced through the anus and                            advanced to the the cecum, identified by                            appendiceal orifice and  ileocecal valve. The                            ileocecal valve, appendiceal orifice, and rectum                            were photographed. The quality of the bowel                            preparation was good. The colonoscopy was performed                            without difficulty. The patient tolerated the                            procedure well. Scope In: 1:43:47 PM Scope Out: 2:01:23 PM Scope Withdrawal Time: 0 hours 14 minutes 19 seconds  Total Procedure Duration: 0 hours 17 minutes 36 seconds  Findings:                 The perianal and digital rectal examinations were                            normal.                           A 6 mm polyp was found in the transverse colon. The                            polyp was sessile. The polyp was removed with a                            cold snare. Resection and retrieval were complete.                           Internal hemorrhoids were found during                            retroflexion. The hemorrhoids were small and Grade  I (internal hemorrhoids that do not prolapse).                           The exam was otherwise without abnormality on                            direct and retroflexion views. Complications:            No immediate complications. Estimated blood loss:                            None. Estimated Blood Loss:     Estimated blood loss: none. Impression:               - One 6 mm polyp in the transverse colon, removed                            with a cold snare. Resected and retrieved.                           - Internal hemorrhoids.                           - The examination was otherwise normal on direct                            and retroflexion views. Recommendation:           - Repeat colonoscopy in 5 years for surveillance if                            polyp is precancerous, otherwise 10 years.                           - Patient has a contact number available for                             emergencies. The signs and symptoms of potential                            delayed complications were discussed with the                            patient. Return to normal activities tomorrow.                            Written discharge instructions were provided to the                            patient.                           - Resume previous diet.                           - Continue present medications.                           -  Await pathology results. Ladene Artist, MD 11/03/2016 2:05:22 PM This report has been signed electronically.

## 2016-11-04 ENCOUNTER — Telehealth: Payer: Self-pay

## 2016-11-04 NOTE — Telephone Encounter (Signed)
  Follow up Call-  Call back number 11/03/2016  Post procedure Call Back phone  # [phone]  Permission to leave phone message Yes  Some recent data might be hidden     Patient questions:  Do you have a fever, pain , or abdominal swelling? No. Pain Score  0 *  Have you tolerated food without any problems? Yes.    Have you been able to return to your normal activities? Yes.    Do you have any questions about your discharge instructions: Diet   No. Medications  No. Follow up visit  No.  Do you have questions or concerns about your Care? No.  Actions: * If pain score is 4 or above: No action needed, pain <4.

## 2016-11-09 ENCOUNTER — Encounter: Payer: Self-pay | Admitting: Gastroenterology

## 2016-12-15 ENCOUNTER — Ambulatory Visit: Payer: PRIVATE HEALTH INSURANCE | Admitting: Internal Medicine

## 2017-07-27 ENCOUNTER — Encounter: Payer: Self-pay | Admitting: Internal Medicine

## 2017-07-27 ENCOUNTER — Ambulatory Visit: Payer: PRIVATE HEALTH INSURANCE | Admitting: Internal Medicine

## 2017-07-27 ENCOUNTER — Telehealth: Payer: Self-pay | Admitting: Internal Medicine

## 2017-07-27 ENCOUNTER — Other Ambulatory Visit (INDEPENDENT_AMBULATORY_CARE_PROVIDER_SITE_OTHER): Payer: PRIVATE HEALTH INSURANCE

## 2017-07-27 DIAGNOSIS — E538 Deficiency of other specified B group vitamins: Secondary | ICD-10-CM

## 2017-07-27 DIAGNOSIS — E669 Obesity, unspecified: Secondary | ICD-10-CM

## 2017-07-27 DIAGNOSIS — E559 Vitamin D deficiency, unspecified: Secondary | ICD-10-CM | POA: Diagnosis not present

## 2017-07-27 DIAGNOSIS — E1169 Type 2 diabetes mellitus with other specified complication: Secondary | ICD-10-CM

## 2017-07-27 DIAGNOSIS — G43109 Migraine with aura, not intractable, without status migrainosus: Secondary | ICD-10-CM

## 2017-07-27 DIAGNOSIS — F4323 Adjustment disorder with mixed anxiety and depressed mood: Secondary | ICD-10-CM | POA: Diagnosis not present

## 2017-07-27 LAB — HEMOGLOBIN A1C: Hgb A1c MFr Bld: 8 % — ABNORMAL HIGH (ref 4.6–6.5)

## 2017-07-27 MED ORDER — KETOROLAC TROMETHAMINE 10 MG PO TABS: 10.0000 mg | ORAL_TABLET | Freq: Four times a day (QID) | ORAL | 3 refills | Status: DC | PRN

## 2017-07-27 MED ORDER — BUTALBITAL-ACETAMINOPHEN 50-300 MG PO TABS: 1.0000 | ORAL_TABLET | Freq: Two times a day (BID) | ORAL | 2 refills | Status: DC | PRN

## 2017-07-27 MED ORDER — ONDANSETRON HCL 4 MG PO TABS: 4.0000 mg | ORAL_TABLET | Freq: Three times a day (TID) | ORAL | 2 refills | Status: DC | PRN

## 2017-07-27 NOTE — Telephone Encounter (Signed)
Patient has dropped renewal FMLA forms during her visit on 5/22. Forms have been completed & placed in providers box to review and sign.

## 2017-07-27 NOTE — Assessment & Plan Note (Signed)
Discussed.

## 2017-07-27 NOTE — Progress Notes (Signed)
Subjective:  Patient ID: Autumn Howard, female    DOB: 1961/02/08  Age: 57 y.o. MRN: [id number]  CC: No chief complaint on file.   HPI Autumn Howard presents for anxiety, HA, hypothyroidism, shift work, B12 def f/u  Outpatient Medications Prior to Visit  Medication Sig Dispense Refill  . Butalbital-Acetaminophen (BUPAP) 50-300 MG TABS Take 1 tablet by mouth 2 (two) times daily as needed. 60 tablet 2  . Cholecalciferol (VITAMIN D3) 2000 units capsule Take 1 capsule (2,000 Units total) by mouth daily. 100 capsule 3  . Cyanocobalamin (VITAMIN B-12) 1000 MCG SUBL Place 1 tablet (1,000 mcg total) under the tongue daily. 100 tablet 3  . ketorolac (TORADOL) 10 MG tablet Take 1 tablet (10 mg total) by mouth every 6 (six) hours as needed. 30 tablet 3  . levothyroxine (SYNTHROID, LEVOTHROID) 100 MCG tablet Take 1 tablet (100 mcg total) by mouth daily. 30 tablet 11  . metFORMIN (GLUCOPHAGE) 500 MG tablet Take 1 tablet (500 mg total) by mouth 2 (two) times daily with a meal. 60 tablet 11  . ondansetron (ZOFRAN) 4 MG tablet Take 1 tablet (4 mg total) by mouth every 8 (eight) hours as needed for nausea. 30 tablet 2  . thyroid (ARMOUR) 60 MG tablet Take 60 mg by mouth daily before breakfast. 60 mg AM, 90mg  PM    . bismuth subsalicylate (PEPTO-BISMOL) 262 MG/15ML suspension Take 30 mLs by mouth every 4 (four) hours as needed for indigestion. (Patient not taking: Reported on 10/21/2016) 360 mL 0  . thyroid (ARMOUR) 65 MG tablet Take 65 mg by mouth daily. Evening    . thyroid (NATURE-THROID) 130 MG tablet Take 130 mg by mouth daily. Morning     Facility-Administered Medications Prior to Visit  Medication Dose Route Frequency Provider Last Rate Last Dose  . 0.9 %  sodium chloride infusion  500 mL Intravenous Continuous Ladene Artist, MD        ROS Review of Systems  Constitutional: Negative for activity change, appetite change, chills, fatigue and unexpected weight change.  HENT: Negative for  congestion, mouth sores and sinus pressure.   Eyes: Negative for visual disturbance.  Respiratory: Negative for cough and chest tightness.   Gastrointestinal: Negative for abdominal pain and nausea.  Genitourinary: Negative for difficulty urinating, frequency and vaginal pain.  Musculoskeletal: Negative for back pain and gait problem.  Skin: Negative for pallor and rash.  Neurological: Negative for dizziness, tremors, weakness, numbness and headaches.  Psychiatric/Behavioral: Negative for confusion, sleep disturbance and suicidal ideas.    Objective:  BP 120/78 (BP Location: Left Arm, Patient Position: Sitting, Cuff Size: Large)   Pulse 73   Temp 98 F (36.7 C) (Oral)   Ht 5\' 9"  (1.753 m)   Wt 219 lb (99.3 kg)   LMP 12/22/2011   SpO2 98%   BMI 32.34 kg/m   BP Readings from Last 3 Encounters:  07/27/17 120/78  11/03/16 (!) 109/53  07/19/16 106/84    Wt Readings from Last 3 Encounters:  07/27/17 219 lb (99.3 kg)  11/03/16 197 lb (89.4 kg)  10/21/16 197 lb 9.6 oz (89.6 kg)    Physical Exam  Constitutional: She appears well-developed. No distress.  HENT:  Head: Normocephalic.  Right Ear: External ear normal.  Left Ear: External ear normal.  Nose: Nose normal.  Mouth/Throat: Oropharynx is clear and moist.  Eyes: Pupils are equal, round, and reactive to light. Conjunctivae are normal. Right eye exhibits no discharge. Left eye exhibits no  discharge.  Neck: Normal range of motion. Neck supple. No JVD present. No tracheal deviation present. No thyromegaly present.  Cardiovascular: Normal rate, regular rhythm and normal heart sounds.  Pulmonary/Chest: No stridor. No respiratory distress. She has no wheezes.  Abdominal: Soft. Bowel sounds are normal. She exhibits no distension and no mass. There is no tenderness. There is no rebound and no guarding.  Musculoskeletal: She exhibits no edema or tenderness.  Lymphadenopathy:    She has no cervical adenopathy.  Neurological: She  displays normal reflexes. No cranial nerve deficit. She exhibits normal muscle tone. Coordination normal.  Skin: No rash noted. No erythema.  Psychiatric: She has a normal mood and affect. Her behavior is normal. Judgment and thought content normal.    Lab Results  Component Value Date   WBC 6.1 07/19/2016   HGB 15.3 (H) 07/19/2016   HCT 44.9 07/19/2016   PLT 172.0 07/19/2016   GLUCOSE 300 (H) 06/22/2016   CHOL 252 (H) 06/22/2016   TRIG 340.0 (H) 06/22/2016   HDL 38.00 (L) 06/22/2016   LDLDIRECT 135.0 06/22/2016   LDLCALC 133 (H) 01/11/2012   ALT 31 07/19/2016   AST 34 07/19/2016   NA 138 06/22/2016   K 4.4 06/22/2016   CL 103 06/22/2016   CREATININE 1.12 06/22/2016   BUN 16 06/22/2016   CO2 28 06/22/2016   TSH 57.43 Auto-dilution (H) 06/22/2016   INR 0.95 11/25/2009   HGBA1C 9.1 07/12/2016    Dg Chest 2 View  Result Date: 04/09/2014 CLINICAL DATA:  57 year old female with a history of cough x 1 wk Thx! EXAM: CHEST - 2 VIEW COMPARISON:  11/25/2009 FINDINGS: Cardiomediastinal silhouette projects within normal limits in size and contour. No confluent airspace disease, pneumothorax, or pleural effusion. No displaced fracture. Unremarkable appearance of the upper abdomen. IMPRESSION: No radiographic evidence of acute cardiopulmonary disease. Signed, Dulcy Fanny. Earleen Newport, DO [facility] Radiology Electronically Signed   By: Corrie Mckusick D.O.   On: 04/09/2014 17:16    Assessment & Plan:   There are no diagnoses linked to this encounter. I have discontinued Carlye P. Sandall's bismuth subsalicylate. I am also having her maintain her Butalbital-Acetaminophen, ketorolac, ondansetron, Vitamin D3, levothyroxine, Vitamin B-12, metFORMIN, and thyroid. We will continue to administer sodium chloride.  No orders of the defined types were placed in this encounter.    Follow-up: No follow-ups on file.  Walker Kehr, MD

## 2017-07-27 NOTE — Assessment & Plan Note (Signed)
Metformin 

## 2017-07-27 NOTE — Assessment & Plan Note (Signed)
Risks associated with treatment noncompliance were discussed. Compliance was encouraged. 

## 2017-07-27 NOTE — Patient Instructions (Signed)
Armodafinil tablets What is this medicine? ARMODAFINIL (ar moe DAF i nil) is used to treat excessive sleepiness caused by certain sleep disorders. This includes narcolepsy, sleep apnea, and shift work sleep disorder. This medicine may be used for other purposes; ask your health care provider or pharmacist if you have questions. COMMON BRAND NAME(S): Nuvigil What should I tell my health care provider before I take this medicine? They need to know if you have any of these conditions: -bipolar disorder -depression -drug or alcohol abuse or addiction -heart disease -high blood pressure -kidney disease -liver disease -schizophrenia -suicidal thoughts, plans, or attempt; a previous suicide attempt by you or a family member -an unusual or allergic reaction to armodafinil, modafinil, medicines, foods, dyes, or preservatives -pregnant or trying to get pregnant -breast-feeding How should I use this medicine? Take this medicine by mouth with a glass of water. Follow the directions on the prescription label. Take your doses at regular intervals. Do not take your medicine more often than directed. Do not stop taking this medicine suddenly except upon the advice of your doctor. Stopping this medicine too quickly may cause serious side effects or your condition may worsen. A special MedGuide will be given to you by the pharmacist with each prescription and refill. Be sure to read this information carefully each time. Talk to your pediatrician regarding the use of this medicine in children. While this drug may be prescribed for children as young as 17 years of age for selected conditions, precautions do apply. Overdosage: If you think you have taken too much of this medicine contact a poison control center or emergency room at once. NOTE: This medicine is only for you. Do not share this medicine with others. What if I miss a dose? If you miss a dose, take it as soon as you can. If it is almost time for  your next dose, take only that dose. Do not take double or extra doses. What may interact with this medicine? Do not take this medicine with any of the following medications: -amphetamine or dextroamphetamine -dexmethylphenidate or methylphenidate -MAOIs like Carbex, Eldepryl, Marplan, Nardil, and Parnate -pemoline -procarbazine This medicine may also interact with the following medications: -antifungal medicines like itraconazole or ketoconazole -barbiturates, like phenobarbital -birth control pills or other hormone-containing birth control devices or implants -carbamazepine -cyclosporine -diazepam -medicines for depression, anxiety, or psychotic disturbances -phenytoin -propranolol -triazolam -warfarin This list may not describe all possible interactions. Give your health care provider a list of all the medicines, herbs, non-prescription drugs, or dietary supplements you use. Also tell them if you smoke, drink alcohol, or use illegal drugs. Some items may interact with your medicine. What should I watch for while using this medicine? Visit your doctor or health care professional for regular checks on your progress. The full effect of this medicine may not be seen right away. This medicine may affect your concentration, function, or may hide signs that you are tired. You may get dizzy. This medicine will not eliminate your abnormal tendency to fall asleep and is not a replacement for sleep. Do not change your previous behavior regarding potentially dangerous activities, such as driving, using machinery, or doing anything that needs mental alertness until you know how this drug affects you. Alcohol can make you more dizzy and may interfere with your response to this medicine or your alertness. Avoid alcoholic drinks. Birth control pills may not work properly while you are taking this medicine. While using birth control pills, you will need an   additional barrier method or an alternative  non-hormonal method of birth control during treatment with armodafinil and for 1 month after stopping armodafinil. Talk to your doctor about which extra method of birth control is right for you. It is unknown if the effects of this medicine will be increased by the use of caffeine. Caffeine is found in many foods, beverages, and medications. Ask your doctor if you should limit or change your intake of caffeine-containing products while on this medicine. Do not stop previously prescribed treatments for your condition, such as a CPAP machine, except on the advise of your physician or health care professional. What side effects may I notice from receiving this medicine? Side effects that you should report to your doctor or health care professional as soon as possible: -allergic reactions like skin rash, itching or hives, swelling of the face, lips, or tongue -anxiety -breathing or swallowing problems -chest pain -depressed mood -elevated mood, decreased need for sleep, racing thoughts, impulsive behavior -fast, irregular heartbeat -fever with rash, swollen lymph nodes, or swelling of the face -hallucination, loss of contact with reality -increased blood pressure -mouth sores, blisters, or peeling skin -sore throat, fever, or chills -suicidal thoughts or other mood changes -tremors -vomiting Side effects that usually do not require medical attention (report to your doctor or health care professional if they continue or are bothersome): -headache -nausea, diarrhea, or stomach upset -nervousness -trouble sleeping This list may not describe all possible side effects. Call your doctor for medical advice about side effects. You may report side effects to FDA at 1-800-FDA-1088. Where should I keep my medicine? Keep out of the reach of children. Store at room temperature between 20 and 25 degrees C (68 and 77 degrees F). Throw away any unused medicine after the expiration date. NOTE: This sheet is  a summary. It may not cover all possible information. If you have questions about this medicine, talk to your doctor, pharmacist, or health care provider.  2018 Elsevier/Gold Standard (2015-08-01 18:16:30)

## 2017-07-27 NOTE — Assessment & Plan Note (Addendum)
Injectables discussed Fioricet prn, Toradol, Zofran prn

## 2017-07-28 ENCOUNTER — Other Ambulatory Visit (INDEPENDENT_AMBULATORY_CARE_PROVIDER_SITE_OTHER): Payer: PRIVATE HEALTH INSURANCE

## 2017-07-28 DIAGNOSIS — G43101 Migraine with aura, not intractable, with status migrainosus: Secondary | ICD-10-CM

## 2017-07-28 LAB — CBC WITH DIFFERENTIAL/PLATELET
Basophils Absolute: 0.1 10*3/uL (ref 0.0–0.1)
Basophils Relative: 1.3 % (ref 0.0–3.0)
Eosinophils Absolute: 0.3 10*3/uL (ref 0.0–0.7)
Eosinophils Relative: 4 % (ref 0.0–5.0)
HCT: 43 % (ref 36.0–46.0)
Hemoglobin: 14.5 g/dL (ref 12.0–15.0)
Lymphocytes Relative: 37.9 % (ref 12.0–46.0)
Lymphs Abs: 2.5 10*3/uL (ref 0.7–4.0)
MCHC: 33.7 g/dL (ref 30.0–36.0)
MCV: 90.4 fl (ref 78.0–100.0)
Monocytes Absolute: 0.4 10*3/uL (ref 0.1–1.0)
Monocytes Relative: 6.5 % (ref 3.0–12.0)
Neutro Abs: 3.3 10*3/uL (ref 1.4–7.7)
Neutrophils Relative %: 50.3 % (ref 43.0–77.0)
Platelets: 185 10*3/uL (ref 150.0–400.0)
RBC: 4.76 Mil/uL (ref 3.87–5.11)
RDW: 14.6 % (ref 11.5–15.5)
WBC: 6.6 10*3/uL (ref 4.0–10.5)

## 2017-07-29 ENCOUNTER — Other Ambulatory Visit (INDEPENDENT_AMBULATORY_CARE_PROVIDER_SITE_OTHER): Payer: PRIVATE HEALTH INSURANCE

## 2017-07-29 DIAGNOSIS — E1169 Type 2 diabetes mellitus with other specified complication: Secondary | ICD-10-CM

## 2017-07-29 DIAGNOSIS — E559 Vitamin D deficiency, unspecified: Secondary | ICD-10-CM

## 2017-07-29 DIAGNOSIS — G43109 Migraine with aura, not intractable, without status migrainosus: Secondary | ICD-10-CM | POA: Diagnosis not present

## 2017-07-29 DIAGNOSIS — E538 Deficiency of other specified B group vitamins: Secondary | ICD-10-CM | POA: Diagnosis not present

## 2017-07-29 DIAGNOSIS — E039 Hypothyroidism, unspecified: Secondary | ICD-10-CM

## 2017-07-29 DIAGNOSIS — E669 Obesity, unspecified: Secondary | ICD-10-CM | POA: Diagnosis not present

## 2017-07-29 LAB — TSH: TSH: 16.54 u[IU]/mL — AB (ref 0.35–4.50)

## 2017-07-29 LAB — CBC WITH DIFFERENTIAL/PLATELET
BASOS ABS: 0.1 10*3/uL (ref 0.0–0.1)
BASOS PCT: 1.3 % (ref 0.0–3.0)
EOS ABS: 0.2 10*3/uL (ref 0.0–0.7)
EOS PCT: 3.8 % (ref 0.0–5.0)
HCT: 40.8 % (ref 36.0–46.0)
Hemoglobin: 14.2 g/dL (ref 12.0–15.0)
LYMPHS PCT: 38.5 % (ref 12.0–46.0)
Lymphs Abs: 2.3 10*3/uL (ref 0.7–4.0)
MCHC: 34.8 g/dL (ref 30.0–36.0)
MCV: 89.1 fl (ref 78.0–100.0)
MONO ABS: 0.4 10*3/uL (ref 0.1–1.0)
Monocytes Relative: 7.2 % (ref 3.0–12.0)
NEUTROS PCT: 49.2 % (ref 43.0–77.0)
Neutro Abs: 2.9 10*3/uL (ref 1.4–7.7)
PLATELETS: 178 10*3/uL (ref 150.0–400.0)
RBC: 4.58 Mil/uL (ref 3.87–5.11)
RDW: 15.1 % (ref 11.5–15.5)
WBC: 6 10*3/uL (ref 4.0–10.5)

## 2017-07-29 LAB — HEPATIC FUNCTION PANEL
ALT: 21 U/L (ref 0–35)
AST: 20 U/L (ref 0–37)
Albumin: 4.3 g/dL (ref 3.5–5.2)
Alkaline Phosphatase: 85 U/L (ref 39–117)
BILIRUBIN DIRECT: 0.1 mg/dL (ref 0.0–0.3)
BILIRUBIN TOTAL: 1 mg/dL (ref 0.2–1.2)
Total Protein: 7.4 g/dL (ref 6.0–8.3)

## 2017-07-29 LAB — BASIC METABOLIC PANEL
BUN: 19 mg/dL (ref 6–23)
CHLORIDE: 102 meq/L (ref 96–112)
CO2: 27 mEq/L (ref 19–32)
Calcium: 9.4 mg/dL (ref 8.4–10.5)
Creatinine, Ser: 0.96 mg/dL (ref 0.40–1.20)
GFR: 63.5 mL/min (ref >60.00)
Glucose, Bld: 180 mg/dL — ABNORMAL HIGH (ref 70–99)
POTASSIUM: 4.1 meq/L (ref 3.5–5.1)
Sodium: 138 mEq/L (ref 135–145)

## 2017-07-29 LAB — HEMOGLOBIN A1C: HEMOGLOBIN A1C: 8 % — AB (ref 4.6–6.5)

## 2017-07-29 LAB — VITAMIN B12: VITAMIN B 12: 401 pg/mL (ref 211–911)

## 2017-07-29 LAB — VITAMIN D 25 HYDROXY (VIT D DEFICIENCY, FRACTURES): VITD: 22.52 ng/mL — ABNORMAL LOW (ref 30.00–100.00)

## 2017-07-29 NOTE — Telephone Encounter (Signed)
Forms have been signed, Copy sent to scan & charged for.   Patient informed -  no fax #, she will pick up the original and take them to employer.

## 2017-07-30 ENCOUNTER — Other Ambulatory Visit: Payer: Self-pay | Admitting: Internal Medicine

## 2017-07-30 MED ORDER — METFORMIN HCL 500 MG PO TABS: 500.0000 mg | ORAL_TABLET | Freq: Two times a day (BID) | ORAL | 11 refills | Status: DC

## 2017-07-30 MED ORDER — VITAMIN B-12 1000 MCG SL SUBL: 1.0000 | SUBLINGUAL_TABLET | Freq: Every day | SUBLINGUAL | 3 refills | Status: AC

## 2017-07-30 MED ORDER — VITAMIN D3 1.25 MG (50000 UT) PO CAPS: 1.0000 | ORAL_CAPSULE | ORAL | 0 refills | Status: DC

## 2017-07-30 MED ORDER — LEVOTHYROXINE SODIUM 100 MCG PO TABS: 100.0000 ug | ORAL_TABLET | Freq: Every day | ORAL | 11 refills | Status: DC

## 2017-07-30 MED ORDER — VITAMIN D3 50 MCG (2000 UT) PO CAPS: 2000.0000 [IU] | ORAL_CAPSULE | Freq: Every day | ORAL | 3 refills | Status: AC

## 2017-11-15 ENCOUNTER — Ambulatory Visit: Payer: PRIVATE HEALTH INSURANCE | Admitting: Internal Medicine

## 2018-08-03 ENCOUNTER — Ambulatory Visit (INDEPENDENT_AMBULATORY_CARE_PROVIDER_SITE_OTHER): Payer: PRIVATE HEALTH INSURANCE | Admitting: Internal Medicine

## 2018-08-03 ENCOUNTER — Encounter: Payer: Self-pay | Admitting: Internal Medicine

## 2018-08-03 DIAGNOSIS — E1169 Type 2 diabetes mellitus with other specified complication: Secondary | ICD-10-CM | POA: Diagnosis not present

## 2018-08-03 DIAGNOSIS — G43109 Migraine with aura, not intractable, without status migrainosus: Secondary | ICD-10-CM

## 2018-08-03 DIAGNOSIS — E034 Atrophy of thyroid (acquired): Secondary | ICD-10-CM | POA: Diagnosis not present

## 2018-08-03 DIAGNOSIS — E669 Obesity, unspecified: Secondary | ICD-10-CM | POA: Diagnosis not present

## 2018-08-03 MED ORDER — METHYLPREDNISOLONE 4 MG PO TBPK: ORAL_TABLET | ORAL | 0 refills | Status: DC

## 2018-08-03 MED ORDER — UBROGEPANT 100 MG PO TABS: 100.0000 mg | ORAL_TABLET | Freq: Every day | ORAL | 5 refills | Status: DC | PRN

## 2018-08-03 NOTE — Assessment & Plan Note (Signed)
Severe migraine episode.  Go to ER if worse.  Prescribed Ubrelvy100 milligrams daily as needed.  Medrol Dosepak prescription- glucose elevation discussed

## 2018-08-03 NOTE — Progress Notes (Signed)
Virtual Visit via Video Note  I connected with Autumn Howard on 08/03/18 at  1:20 PM EDT by a video enabled telemedicine application and verified that I am speaking with the correct person using two identifiers.   I discussed the limitations of evaluation and management by telemedicine and the availability of in person appointments. The patient expressed understanding and agreed to proceed.  History of Present Illness: Autumn Howard is complaining of severe migraine headache all about 3 days duration.  She rates it at 8 out of 10 in intensity.  She threw up last night she is feeling bad all over.  She tried Toradol, Zofran, Vicodin, Advil with no improvement. She has been seeing an endocrinologist for her diabetes and hypothyroidism.  She started on metformin 1000 mg twice a day.  She started Ozempic 2 weeks ago.  Her last hemoglobin A1c was 7.9%.  Her Synthroid was recently increased 237 mcg/day.  There has been no runny nose, cough, chest pain, shortness of breath, abdominal pain, diarrhea, constipation fever, skin rashes.   Observations/Objective: The patient appears to be in no acute distress, looks tired.  She is laying in bed  Assessment and Plan:  See my Assessment and Plan. Follow Up Instructions:    I discussed the assessment and treatment plan with the patient. The patient was provided an opportunity to ask questions and all were answered. The patient agreed with the plan and demonstrated an understanding of the instructions.   The patient was advised to call back or seek an in-person evaluation if the symptoms worsen or if the condition fails to improve as anticipated.  I provided face-to-face time during this encounter. We were at different locations.   Walker Kehr, MD

## 2018-08-03 NOTE — Assessment & Plan Note (Signed)
F/u w/endocrinology at [facility].  On Synthroid 137 mcg daily

## 2018-08-03 NOTE — Assessment & Plan Note (Signed)
Metformin 1000 mg po twice daily.  Ozempic weekly Discontinue Ozempic if headaches persist

## 2018-09-13 ENCOUNTER — Telehealth: Payer: Self-pay | Admitting: Internal Medicine

## 2018-09-13 NOTE — Telephone Encounter (Signed)
PA was denied, appeal done via cover my meds

## 2018-09-13 NOTE — Telephone Encounter (Signed)
Mat with Covermymeds  Calling to advise the med Ubrogepant (UBRELVY) 100 MG TABS  Needs a prior auth.  covermymeds cb  [phone]  ref key [id number]  You will need to contact: [employer] [facility] prior no [phone]

## 2018-09-15 NOTE — Telephone Encounter (Signed)
Appeal approved through 09/14/2019

## 2019-03-19 ENCOUNTER — Telehealth: Payer: Self-pay

## 2019-03-19 NOTE — Telephone Encounter (Signed)
New message    Call transfer from the [facility]   The patient having migraines and wants to come to the office for a shot.   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    Advise patient on a virtual visit, seek clarification from [place].   Patient aware of the virtual visit, decline visit at this time.

## 2019-03-20 NOTE — Telephone Encounter (Signed)
Okay. Thank you.

## 2019-03-20 NOTE — Telephone Encounter (Signed)
Okay for pt to come in the office?

## 2019-03-21 NOTE — Telephone Encounter (Signed)
MD ok appt pls call pt to schedule.Autumn Kitchenlmb

## 2019-03-21 NOTE — Telephone Encounter (Signed)
F/u  Call the patient to offer an appointment per Dr. Judeen Hammans recommendation.   The patient stated she wanted to come into the office and have a shot verse a virtual visit.   The patient stated she took her medication and no visit is needed at this time.   The patient thanked me for calling back with follow up verbalized understanding.

## 2019-04-19 ENCOUNTER — Encounter: Payer: Self-pay | Admitting: Internal Medicine

## 2019-04-19 ENCOUNTER — Ambulatory Visit (INDEPENDENT_AMBULATORY_CARE_PROVIDER_SITE_OTHER): Payer: PRIVATE HEALTH INSURANCE | Admitting: Internal Medicine

## 2019-04-19 DIAGNOSIS — B349 Viral infection, unspecified: Secondary | ICD-10-CM | POA: Diagnosis not present

## 2019-04-19 DIAGNOSIS — R42 Dizziness and giddiness: Secondary | ICD-10-CM | POA: Diagnosis not present

## 2019-04-19 DIAGNOSIS — E1169 Type 2 diabetes mellitus with other specified complication: Secondary | ICD-10-CM | POA: Diagnosis not present

## 2019-04-19 DIAGNOSIS — E669 Obesity, unspecified: Secondary | ICD-10-CM

## 2019-04-19 MED ORDER — MECLIZINE HCL 12.5 MG PO TABS: 12.5000 mg | ORAL_TABLET | Freq: Three times a day (TID) | ORAL | 1 refills | Status: AC | PRN

## 2019-04-19 MED ORDER — METHYLPREDNISOLONE 4 MG PO TBPK: ORAL_TABLET | ORAL | 0 refills | Status: DC

## 2019-04-19 NOTE — Assessment & Plan Note (Signed)
CBGs 90-100 per pt

## 2019-04-19 NOTE — Assessment & Plan Note (Signed)
Meclizine prn 

## 2019-04-19 NOTE — Progress Notes (Signed)
Virtual Visit via Video Note  I connected with Autumn Howard on 04/19/19 at  2:20 PM EST by a video enabled telemedicine application and verified that I am speaking with the correct person using two identifiers.   I discussed the limitations of evaluation and management by telemedicine and the availability of in person appointments. The patient expressed understanding and agreed to proceed.  History of Present Illness: C/o vertigo, weakness, fever since Sat. Went to [place] test was (-). C/o minor HA. Sense of taste is diminished Friends have COVID   There has been some runny nose, cough. No chest pain, shortness of breath, abdominal pain. No diarrhea, constipation.   Observations/Objective: The patient appears to be in no acute distress, looks tired.  Assessment and Plan:  See my Assessment and Plan. Follow Up Instructions:    I discussed the assessment and treatment plan with the patient. The patient was provided an opportunity to ask questions and all were answered. The patient agreed with the plan and demonstrated an understanding of the instructions.   The patient was advised to call back or seek an in-person evaluation if the symptoms worsen or if the condition fails to improve as anticipated.  I provided face-to-face time during this encounter. We were at different locations.   Walker Kehr, MD

## 2019-04-19 NOTE — Assessment & Plan Note (Signed)
?  COVID19 Repeat COVID test Medrol pack Hydration To ER if worse

## 2019-12-19 LAB — HM DIABETES EYE EXAM

## 2019-12-20 ENCOUNTER — Encounter: Payer: Self-pay | Admitting: Internal Medicine

## 2020-07-09 LAB — HM MAMMOGRAPHY

## 2020-07-10 ENCOUNTER — Encounter: Payer: Self-pay | Admitting: Internal Medicine

## 2020-07-28 LAB — HM PAP SMEAR: HM Pap smear: NORMAL

## 2020-08-01 ENCOUNTER — Encounter: Payer: Self-pay | Admitting: Internal Medicine

## 2020-10-21 ENCOUNTER — Other Ambulatory Visit: Payer: Self-pay

## 2020-10-21 ENCOUNTER — Ambulatory Visit: Payer: No Typology Code available for payment source | Admitting: Internal Medicine

## 2020-10-21 ENCOUNTER — Encounter: Payer: Self-pay | Admitting: Internal Medicine

## 2020-10-21 ENCOUNTER — Other Ambulatory Visit: Payer: Self-pay | Admitting: Internal Medicine

## 2020-10-21 VITALS — BP 110/82 | HR 70 | Temp 98.1°F | Ht 69.0 in | Wt 179.2 lb

## 2020-10-21 DIAGNOSIS — E034 Atrophy of thyroid (acquired): Secondary | ICD-10-CM

## 2020-10-21 DIAGNOSIS — N183 Chronic kidney disease, stage 3 unspecified: Secondary | ICD-10-CM | POA: Insufficient documentation

## 2020-10-21 DIAGNOSIS — E559 Vitamin D deficiency, unspecified: Secondary | ICD-10-CM

## 2020-10-21 DIAGNOSIS — R7989 Other specified abnormal findings of blood chemistry: Secondary | ICD-10-CM

## 2020-10-21 DIAGNOSIS — E1169 Type 2 diabetes mellitus with other specified complication: Secondary | ICD-10-CM

## 2020-10-21 DIAGNOSIS — R110 Nausea: Secondary | ICD-10-CM

## 2020-10-21 DIAGNOSIS — E669 Obesity, unspecified: Secondary | ICD-10-CM

## 2020-10-21 DIAGNOSIS — E538 Deficiency of other specified B group vitamins: Secondary | ICD-10-CM | POA: Diagnosis not present

## 2020-10-21 DIAGNOSIS — G43109 Migraine with aura, not intractable, without status migrainosus: Secondary | ICD-10-CM

## 2020-10-21 LAB — COMPREHENSIVE METABOLIC PANEL
ALT: 10 U/L (ref 0–35)
AST: 14 U/L (ref 0–37)
Albumin: 4.6 g/dL (ref 3.5–5.2)
Alkaline Phosphatase: 64 U/L (ref 39–117)
BUN: 31 mg/dL — ABNORMAL HIGH (ref 6–23)
CO2: 26 mEq/L (ref 19–32)
Calcium: 10 mg/dL (ref 8.4–10.5)
Chloride: 106 mEq/L (ref 96–112)
Creatinine, Ser: 1.22 mg/dL — ABNORMAL HIGH (ref 0.40–1.20)
GFR: 48.07 mL/min — ABNORMAL LOW (ref >60.00)
Glucose, Bld: 90 mg/dL (ref 70–99)
Potassium: 3.9 mEq/L (ref 3.5–5.1)
Sodium: 140 mEq/L (ref 135–145)
Total Bilirubin: 1.1 mg/dL (ref 0.2–1.2)
Total Protein: 7.8 g/dL (ref 6.0–8.3)

## 2020-10-21 LAB — LIPASE: Lipase: 13 U/L (ref 11.0–59.0)

## 2020-10-21 MED ORDER — ONDANSETRON HCL 4 MG PO TABS: 4.0000 mg | ORAL_TABLET | Freq: Three times a day (TID) | ORAL | 1 refills | Status: AC | PRN

## 2020-10-21 MED ORDER — KETOROLAC TROMETHAMINE 10 MG PO TABS: 10.0000 mg | ORAL_TABLET | Freq: Four times a day (QID) | ORAL | 3 refills | Status: DC | PRN

## 2020-10-21 MED ORDER — ONDANSETRON HCL 4 MG/2ML IJ SOLN
4.0000 mg | Freq: Once | INTRAMUSCULAR | Status: AC
  Administered 2020-10-21: 4 mg via INTRAMUSCULAR

## 2020-10-21 MED ORDER — UBRELVY 100 MG PO TABS: 100.0000 mg | ORAL_TABLET | Freq: Every day | ORAL | 5 refills | Status: DC | PRN

## 2020-10-21 NOTE — Assessment & Plan Note (Addendum)
The patient is seeing Dr Gwynneth Macleod -her last A1c was 5.8%. Continue with Metformin 1000 mg po twice daily.  Ozempic weekly.

## 2020-10-21 NOTE — Assessment & Plan Note (Signed)
Dr Gwynneth Macleod Chronic  Risks associated with treatment noncompliance were discussed. Compliance was encouraged. 2020 endocrinology at [facility].  On Synthroid 137 mcg daily

## 2020-10-21 NOTE — Progress Notes (Signed)
Subjective:  Patient ID: Autumn Howard, female    DOB: [birth year]  Age: 60 y.o. MRN: [id number]  CC: Follow-up   HPI Autumn Howard presents for elevated BUN/creat in Jul 2022. C/o nausea - ?due to increase on Ozempic dose Pt lost wt on Ozempic Working days now, full-time    Outpatient Medications Prior to Visit  Medication Sig Dispense Refill   Butalbital-Acetaminophen (BUPAP) 50-300 MG TABS Take 1 tablet by mouth 2 (two) times daily as needed. 60 tablet 2   Cholecalciferol (VITAMIN D3) 2000 units capsule Take 1 capsule (2,000 Units total) by mouth daily. 100 capsule 3   Cyanocobalamin (VITAMIN B-12) 1000 MCG SUBL Place 1 tablet (1,000 mcg total) under the tongue daily. 100 tablet 3   ketorolac (TORADOL) 10 MG tablet Take 1 tablet (10 mg total) by mouth every 6 (six) hours as needed. 30 tablet 3   ondansetron (ZOFRAN) 4 MG tablet Take 1 tablet (4 mg total) by mouth every 8 (eight) hours as needed for nausea. 30 tablet 2   Ubrogepant (UBRELVY) 100 MG TABS Take 100 mg by mouth daily as needed. 6 tablet 5   Cholecalciferol (VITAMIN D3) 50000 units CAPS Take 1 capsule by mouth once a week. (Patient not taking: Reported on 10/21/2020) 6 capsule 0   levothyroxine (SYNTHROID, LEVOTHROID) 100 MCG tablet Take 1 tablet (100 mcg total) by mouth daily. (Patient not taking: Reported on 10/21/2020) 30 tablet 11   metFORMIN (GLUCOPHAGE) 500 MG tablet Take 1 tablet (500 mg total) by mouth 2 (two) times daily with a meal. (Patient not taking: Reported on 10/21/2020) 60 tablet 11   methylPREDNISolone (MEDROL DOSEPAK) 4 MG TBPK tablet As directed (Patient not taking: Reported on 10/21/2020) 21 tablet 0   thyroid (ARMOUR) 60 MG tablet Take 60 mg by mouth daily before breakfast. 60 mg AM, '90mg'$  PM (Patient not taking: Reported on 10/21/2020)     0.9 %  sodium chloride infusion      No facility-administered medications prior to visit.    ROS: Review of Systems  Constitutional:  Positive for fatigue.  Negative for activity change, appetite change, chills and unexpected weight change.  HENT:  Negative for congestion, mouth sores and sinus pressure.   Eyes:  Negative for visual disturbance.  Respiratory:  Negative for cough and chest tightness.   Gastrointestinal:  Negative for abdominal pain and nausea.  Genitourinary:  Negative for difficulty urinating, frequency and vaginal pain.  Musculoskeletal:  Negative for back pain and gait problem.  Skin:  Negative for pallor and rash.  Neurological:  Negative for dizziness, tremors, weakness, numbness and headaches.  Psychiatric/Behavioral:  Negative for confusion, sleep disturbance and suicidal ideas.    Objective:  BP 110/82 (BP Location: Left Arm)   Pulse 70   Temp 98.1 F (36.7 C) (Oral)   Ht '5\' 9"'$  (1.753 m)   Wt 179 lb 3.2 oz (81.3 kg)   LMP 12/22/2011   SpO2 96%   BMI 26.46 kg/m   BP Readings from Last 3 Encounters:  10/21/20 110/82  07/27/17 120/78  11/03/16 (!) 109/53    Wt Readings from Last 3 Encounters:  10/21/20 179 lb 3.2 oz (81.3 kg)  07/27/17 219 lb (99.3 kg)  11/03/16 197 lb (89.4 kg)    Physical Exam Constitutional:      General: She is not in acute distress.    Appearance: She is well-developed.  HENT:     Head: Normocephalic.     Right Ear: External ear  normal.     Left Ear: External ear normal.     Nose: Nose normal.  Eyes:     General:        Right eye: No discharge.        Left eye: No discharge.     Conjunctiva/sclera: Conjunctivae normal.     Pupils: Pupils are equal, round, and reactive to light.  Neck:     Thyroid: No thyromegaly.     Vascular: No JVD.     Trachea: No tracheal deviation.  Cardiovascular:     Rate and Rhythm: Normal rate and regular rhythm.     Heart sounds: Normal heart sounds.  Pulmonary:     Effort: No respiratory distress.     Breath sounds: No stridor. No wheezing.  Abdominal:     General: Bowel sounds are normal. There is no distension.     Palpations: Abdomen is  soft. There is no mass.     Tenderness: There is no abdominal tenderness. There is no guarding or rebound.  Musculoskeletal:        General: No tenderness.     Cervical back: Normal range of motion and neck supple. No rigidity.  Lymphadenopathy:     Cervical: No cervical adenopathy.  Skin:    Findings: No erythema or rash.  Neurological:     Mental Status: She is oriented to person, place, and time.     Cranial Nerves: No cranial nerve deficit.     Motor: No abnormal muscle tone.     Coordination: Coordination normal.     Deep Tendon Reflexes: Reflexes normal.  Psychiatric:        Behavior: Behavior normal.        Thought Content: Thought content normal.        Judgment: Judgment normal.    Lab Results  Component Value Date   WBC 6.0 07/29/2017   HGB 14.2 07/29/2017   HCT 40.8 07/29/2017   PLT 178.0 07/29/2017   GLUCOSE 180 (H) 07/29/2017   CHOL 252 (H) 06/22/2016   TRIG 340.0 (H) 06/22/2016   HDL 38.00 (L) 06/22/2016   LDLDIRECT 135.0 06/22/2016   LDLCALC 133 (H) 01/11/2012   ALT 21 07/29/2017   AST 20 07/29/2017   NA 138 07/29/2017   K 4.1 07/29/2017   CL 102 07/29/2017   CREATININE 0.96 07/29/2017   BUN 19 07/29/2017   CO2 27 07/29/2017   TSH 16.54 (H) 07/29/2017   INR 0.95 11/25/2009   HGBA1C 8.0 (H) 07/29/2017    DG Chest 2 View  Result Date: 04/09/2014 CLINICAL DATA:  60 year old female with a history of cough x 1 wk Thx! EXAM: CHEST - 2 VIEW COMPARISON:  11/25/2009 FINDINGS: Cardiomediastinal silhouette projects within normal limits in size and contour. No confluent airspace disease, pneumothorax, or pleural effusion. No displaced fracture. Unremarkable appearance of the upper abdomen. IMPRESSION: No radiographic evidence of acute cardiopulmonary disease. Signed, Dulcy Fanny. Earleen Newport, DO [facility] Electronically Signed   By: Corrie Mckusick D.O.   On: 04/09/2014 17:16    Assessment & Plan:

## 2020-10-21 NOTE — Assessment & Plan Note (Addendum)
Continue with vitamin B12 

## 2020-10-21 NOTE — Assessment & Plan Note (Addendum)
- 

## 2020-10-27 ENCOUNTER — Other Ambulatory Visit: Payer: Self-pay

## 2020-10-27 ENCOUNTER — Encounter: Payer: Self-pay | Admitting: Internal Medicine

## 2020-10-27 ENCOUNTER — Ambulatory Visit
Admission: RE | Admit: 2020-10-27 | Discharge: 2020-10-27 | Disposition: A | Discharge status: Home / Self Care | Payer: No Typology Code available for payment source | Source: Ambulatory Visit | Attending: Internal Medicine | Admitting: Internal Medicine

## 2020-10-27 DIAGNOSIS — N183 Chronic kidney disease, stage 3 unspecified: Secondary | ICD-10-CM

## 2020-10-27 NOTE — Assessment & Plan Note (Signed)
Discussed with the patient and her GFR 48.  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  Renal ultrasound ordered.  Hydrate well

## 2020-10-27 NOTE — Assessment & Plan Note (Signed)
Likely due to higher dose of Ozempic.  Zofran IM given

## 2020-10-27 NOTE — Assessment & Plan Note (Signed)
Overall better.On Ubrelvi.  Zofran as needed

## 2020-11-04 ENCOUNTER — Other Ambulatory Visit: Payer: Self-pay | Admitting: Internal Medicine

## 2020-11-04 DIAGNOSIS — N183 Chronic kidney disease, stage 3 unspecified: Secondary | ICD-10-CM

## 2020-12-19 ENCOUNTER — Encounter: Payer: Self-pay | Admitting: Internal Medicine

## 2020-12-19 LAB — HM DIABETES EYE EXAM

## 2020-12-22 ENCOUNTER — Encounter: Payer: Self-pay | Admitting: Internal Medicine

## 2021-06-30 ENCOUNTER — Ambulatory Visit: Payer: PRIVATE HEALTH INSURANCE | Admitting: Internal Medicine

## 2021-06-30 ENCOUNTER — Encounter: Payer: Self-pay | Admitting: Internal Medicine

## 2021-06-30 VITALS — BP 130/86 | HR 95 | Temp 97.6°F | Ht 69.0 in | Wt 179.0 lb

## 2021-06-30 DIAGNOSIS — G43109 Migraine with aura, not intractable, without status migrainosus: Secondary | ICD-10-CM

## 2021-06-30 DIAGNOSIS — R55 Syncope and collapse: Secondary | ICD-10-CM

## 2021-06-30 DIAGNOSIS — E034 Atrophy of thyroid (acquired): Secondary | ICD-10-CM

## 2021-06-30 DIAGNOSIS — R5383 Other fatigue: Secondary | ICD-10-CM

## 2021-06-30 DIAGNOSIS — R911 Solitary pulmonary nodule: Secondary | ICD-10-CM

## 2021-06-30 DIAGNOSIS — Z131 Encounter for screening for diabetes mellitus: Secondary | ICD-10-CM

## 2021-06-30 LAB — COMPREHENSIVE METABOLIC PANEL
ALT: 8 U/L (ref 0–35)
AST: 13 U/L (ref 0–37)
Albumin: 4.7 g/dL (ref 3.5–5.2)
Alkaline Phosphatase: 67 U/L (ref 39–117)
BUN: 25 mg/dL — ABNORMAL HIGH (ref 6–23)
CO2: 26 mEq/L (ref 19–32)
Calcium: 9.6 mg/dL (ref 8.4–10.5)
Chloride: 105 mEq/L (ref 96–112)
Creatinine, Ser: 0.99 mg/dL (ref 0.40–1.20)
GFR: 61.47 mL/min (ref >60.00)
Glucose, Bld: 90 mg/dL (ref 70–99)
Potassium: 4.1 mEq/L (ref 3.5–5.1)
Sodium: 139 mEq/L (ref 135–145)
Total Bilirubin: 0.8 mg/dL (ref 0.2–1.2)
Total Protein: 7.7 g/dL (ref 6.0–8.3)

## 2021-06-30 LAB — MAGNESIUM: Magnesium: 1.6 mg/dL (ref 1.5–2.5)

## 2021-06-30 LAB — TSH: TSH: 0.46 u[IU]/mL (ref 0.35–5.50)

## 2021-06-30 LAB — POCT GLYCOSYLATED HEMOGLOBIN (HGB A1C): Hemoglobin A1C: 5.2 % (ref 4.0–5.6)

## 2021-06-30 LAB — VITAMIN D 25 HYDROXY (VIT D DEFICIENCY, FRACTURES): VITD: 39.29 ng/mL (ref 30.00–100.00)

## 2021-06-30 LAB — T4, FREE: Free T4: 1.37 ng/dL (ref 0.60–1.60)

## 2021-06-30 LAB — VITAMIN B12: Vitamin B-12: 1186 pg/mL — ABNORMAL HIGH (ref 211–911)

## 2021-06-30 NOTE — Progress Notes (Signed)
? ?Subjective:  ?Patient ID: Autumn Howard, female    DOB: 02/13/1961  Age: 61 y.o. MRN: [id number] ? ?CC: Follow-up (From ED) ? ? ?HPI ?Autumn Howard presents for ER visit on 06/10/21 for a syncopal episode at home. Lost a lot of wt on Mounjaro ?Not drinking enough water ? ?F/u on pulmonary RLL nodule 1 cm on CT ? ? ?Per ER note: ? ?"Chief Complaint  ?Patient presents with  ? Syncope  ? ?Autumn Howard is a 61 year old female with a history of type 2 diabetes, thyroid disease who presents to the emergency department for syncope. The patient reports that she has had some generalized fatigue and dizziness this week, initially thought it was from drinking on Sunday, but persisted through the rest of the week. She states that she had to call out of work on Monday, Tuesday, and Friday of this week. She has felt generalized weakness, fatigue, and headache coming and going headache that feels similar to prior migraines but keeps recurring. She also reports some episodes of vertigo while lying in bed, not precipitated by moving her head, lasting several minutes at a time. She denies any tinnitus, changes in hearing, focal weakness, slurred speech, numbness or tingling, chest pain, shortness of breath, abdominal pain, or fever. No recent medication changes. She reports that she had a syncopal episode today after getting up and walking into the kitchen. She felt flushed, felt like her vision was closing in on her, and then everything went black. She says she had the back of her head, and her buttocks. She was out for approximately 1 second, and was oriented immediately upon awakening. Reports history of same but has been greater than 1 year. ? ?Past Medical History:  ?Diagnosis Date  ? Skin inflammation 02/12/2013  ? Thyroid disease  ? Type 2 diabetes mellitus with obesity ([place]) 07/10/2018  ? ?Past Surgical History:  ?Procedure Laterality Date  ? BACK SURGERY  ? SKIN BIOPSY  ? ?Family History  ?Problem Relation Age of Onset   ? Cancer Mother  ? Cancer Father  ? Cancer Maternal Grandfather  ? Cancer Paternal Grandmother  ? Cancer Paternal Grandfather  ? Diabetes Neg Hx  ? Psoriasis Neg Hx  ? Eczema Neg Hx  ? ?Social History  ? ?Tobacco Use  ? Smoking status: Former  ? Smokeless tobacco: Never  ?Substance Use Topics  ? Alcohol use: Yes  ?Comment: occ.  ? Drug use: No  ? ?Review of Systems ?Pertinent ROS listed in HPI above.  ? ?Physical Exam  ? ?ED Triage Vitals  ?BP 06/10/21 1859 130/86  ?MAP (mmHg) 06/10/21 2319 81  ?Pulse 06/10/21 1859 81  ?Resp 06/10/21 1859 18  ?Temp 06/10/21 1859 97.8 ?F (36.6 ?C)  ?SpO2 06/10/21 1859 100 %  ?O2 Device 06/10/21 1859 None (Room air)  ?O2 Flow Rate (L/min) --  ?Weight 06/10/21 1859 82.1 kg (181 lb)  ? ?Physical Exam ? ?Physical Exam:  ?General: No acute distress.  ?Eyes: Pupils equally round, reactive to light.  ?Cardiovascular: regular rate and rhythm per auscultation.  ?Respiratory: clear to auscultation bilaterally.  ?GI: Nontender abdomen.  ?Skin: well perfused per inspection, no edema.  ? ?Mental Status Exam:  ?Orientation: Alert and oriented to person, place and time.  ?Memory: Cooperative, follows commands well. Recent and remote memory normal.. Attention, concentration: Attention span and concentration are normal.  ?Language: Speech is clear and language is normal.  ?Fund of knowledge: Vocabulary appropriate for patient age.  ? ?Cranial Nerves:  ?  CN 2 (Optic): Visual fields intact to confrontation. ?CN 3,4,6 (EOM): Pupils equal and reactive to light. Full extraocular eye movement without nystagmus.  ?CN 5 (Trigeminal): Facial sensation is normal, no weakness of masticatory muscles.  ?CN 7 (Facial): No facial weakness or asymmetry.  ?CN 8 (Auditory): Auditory acuity grossly normal.  ?CN 9,10 (Glossophar): The uvula is midline, the palate elevates symmetrically.  ?CN 11 (spinal access): Normal sternocleidomastoid and trapezius strength.  ?CN 12 (Hypoglossal): The tongue is midline. No atrophy or  fasciculations..  ? ?Motor:  ?Strength: 5/5 and symmetric in the upper and lower extremities, no pronation or drift. ?Tone: Tone and muscle bulk are normal in the upper and lower extremities.  ? ?Coordination: Intact finger-to-nose, heel-to-shin, no tremor.  ? ?Sensation: Intact to light touch throughout. ? ?Gait: Routine gait normal.  ? ?ED Course  ?Procedures ?None.  ? ? ? ? ? ?Medical Decision Making ?Positive D dimer: acute illness or injury that poses a threat to life or bodily functions ?Syncope, unspecified syncope type: acute illness or injury that poses a threat to life or bodily functions ?Amount and/or Complexity of Data Reviewed ?Independent Historian: spouse ?External Data Reviewed: ECG. ?Labs: ordered. Decision-making details documented in ED Course. ?Radiology: ordered and independent interpretation performed. Decision-making details documented in ED Course. ?ECG/medicine tests: ordered and independent interpretation performed. Decision-making details documented in ED Course. ? ?Risk ?OTC drugs. ?Prescription drug management. ? ?Brief Overview ?Autumn Howard is a 61 y.o. female who presents with syncopal episode as per above. ? ?I have reviewed the nursing documentation for past medical history, family history, and social history and agree. ? ?I have reviewed the patient's vital signs, which are unremarkable. On initial examination, patient appears mildly fatigued but overall well-appearing, without focal neurologic deficits. She denies any significant pain or vertiginous symptoms at this time. ? ?ECG: I interpreted the ECG. It reveals a sinus rhythm, 81 bpm. EKG remarkable for T wave flattening in lead III, Q waves in V1, V2 with inverted T waves. These changes appear new from prior EKG in 2021. No STEMI or ST depressions. The QTc, PR, and QRS are appropriate. There is no evidence of a high-grade conduction block, HOCM, WPW, Brugada, ARVC, DeWinters T Waves or Wellens Waves.  ? ?Initial  Differential Diagnoses: ?I do not think that this is due to ACS, as the patient denies chest pain, nausea, or shortness of breath, and though she does have some EKG changes from prior, there is no evidence of STEMI; will obtain serial troponins. ? ?Lower suspicion for pulmonary embolism as patient denies malignancy with treatment in the last 6 months or travel involving prolonged car or plane ride, current hormonal therapy, HR <100, O2 sat >95%, no history of DVT/PE, no recent trauma/surgery, no hemoptysis, no and no clinical signs suggestive of DVT. Given the above, I feel that the risk of PE is relatively low, however given syncope will obtain D-dimer. ? ?I doubt aortic dissection is the cause, as the patient has no chest or abdominal pain. Additionally, pulses pre resent bilaterally in upper and lower extremities, and she has no neurologic deficits or hemodynamic instability on exam. Given the patient's hemodynamic stability and lack of applicable history or physical exam findings including no pulsatile mass, I do not think ruptured AAA is present.  ? ?I also considered dysrhythmia, electrolyte abnormality, heart valve abnormality such as aortic stenosis, HOCM, anemia, hypoglycemia, seizure (low suspicion as patient without prior seizure history, no witnessed jerking movements, no postictal state),  stroke (though patient reports some vertiginous symptoms at home, she does not have persistent vertigo, no nystagmus on exam, and no neurologic deficits on exam including deficits in coordination to suggest posterior circulation stroke, therefore I feel this is less likely), or ruptured ectopic pregnancy (do not suspect as patient is postmenopausal), however do not feel that the patient's history and presentation are consistent/we will obtain laboratory studies to further evaluate. ? ?Testing Results: ?Labs revealed: CBC without leukocytosis to suggest significant infection, no anemia to suggest significant blood loss.  CMP unremarkable, no elevation in creatinine to suggest renal dysfunction, no electrolyte derangements to explain her symptoms, no elevation in LFTs or bilirubin. Magnesium low at 1.4. Free T4 within normal lim

## 2021-07-01 ENCOUNTER — Encounter: Payer: Self-pay | Admitting: Internal Medicine

## 2021-07-04 DIAGNOSIS — R911 Solitary pulmonary nodule: Secondary | ICD-10-CM | POA: Insufficient documentation

## 2021-07-04 NOTE — Assessment & Plan Note (Signed)
06/10/21 for a syncopal episode at home. Lost a lot of wt on Mounjaro ?Not drinking enough water ?Hydrate well, rest more ?Obtain labs ?

## 2021-07-04 NOTE — Assessment & Plan Note (Signed)
Cont on Synthroid 125 mcg daily ?

## 2021-07-04 NOTE — Assessment & Plan Note (Addendum)
Discussed ?Obtain repeat CT in 3 months ?

## 2021-07-04 NOTE — Assessment & Plan Note (Signed)
Cont on Fioricet prn - rare use ?

## 2021-07-24 ENCOUNTER — Encounter: Payer: Self-pay | Admitting: Internal Medicine

## 2021-07-24 NOTE — Telephone Encounter (Signed)
Pt is emailing about her CT that was order on 06/30/21...Autumn Howard

## 2021-07-29 ENCOUNTER — Other Ambulatory Visit: Payer: Self-pay | Admitting: Radiology

## 2021-08-04 ENCOUNTER — Encounter: Payer: Self-pay | Admitting: Obstetrics and Gynecology

## 2021-08-14 DIAGNOSIS — D493 Neoplasm of unspecified behavior of breast: Secondary | ICD-10-CM | POA: Insufficient documentation

## 2021-08-14 DIAGNOSIS — C50911 Malignant neoplasm of unspecified site of right female breast: Secondary | ICD-10-CM | POA: Insufficient documentation

## 2021-08-19 ENCOUNTER — Telehealth: Payer: Self-pay | Admitting: *Deleted

## 2021-08-19 NOTE — Telephone Encounter (Signed)
Pt was on cover-my-meds need PA for Ubrelvy. Submitted PA w/ (Key: [id number]) Rec'd msg " [employer] is reviewing your PA request. Typically an electronic response will be received within 24-72 hours. To check for an update late".Marland KitchenJohny Chess

## 2021-08-20 NOTE — Telephone Encounter (Signed)
Checking status on PA received msg " This request has received an Unfavorable outcome. This decision is based on your plan's drug coverage policy for this medication" no alternative was given.Marland KitchenJohny Chess

## 2021-08-24 ENCOUNTER — Inpatient Hospital Stay: Admission: RE | Admit: 2021-08-24 | Payer: PRIVATE HEALTH INSURANCE | Source: Ambulatory Visit

## 2021-08-26 ENCOUNTER — Ambulatory Visit (INDEPENDENT_AMBULATORY_CARE_PROVIDER_SITE_OTHER)
Admission: RE | Admit: 2021-08-26 | Discharge: 2021-08-26 | Disposition: A | Discharge status: Home / Self Care | Payer: PRIVATE HEALTH INSURANCE | Source: Ambulatory Visit | Attending: Internal Medicine | Admitting: Internal Medicine

## 2021-08-26 DIAGNOSIS — R911 Solitary pulmonary nodule: Secondary | ICD-10-CM

## 2021-08-31 DIAGNOSIS — N6481 Ptosis of breast: Secondary | ICD-10-CM | POA: Insufficient documentation

## 2021-08-31 DIAGNOSIS — N62 Hypertrophy of breast: Secondary | ICD-10-CM | POA: Insufficient documentation

## 2021-09-30 ENCOUNTER — Ambulatory Visit: Payer: PRIVATE HEALTH INSURANCE | Admitting: Internal Medicine

## 2021-10-08 ENCOUNTER — Ambulatory Visit: Payer: PRIVATE HEALTH INSURANCE | Admitting: Internal Medicine

## 2021-10-08 ENCOUNTER — Encounter: Payer: Self-pay | Admitting: Internal Medicine

## 2021-10-08 DIAGNOSIS — G43109 Migraine with aura, not intractable, without status migrainosus: Secondary | ICD-10-CM | POA: Diagnosis not present

## 2021-10-08 DIAGNOSIS — E1169 Type 2 diabetes mellitus with other specified complication: Secondary | ICD-10-CM | POA: Diagnosis not present

## 2021-10-08 DIAGNOSIS — E034 Atrophy of thyroid (acquired): Secondary | ICD-10-CM | POA: Diagnosis not present

## 2021-10-08 DIAGNOSIS — F5101 Primary insomnia: Secondary | ICD-10-CM | POA: Diagnosis not present

## 2021-10-08 DIAGNOSIS — E669 Obesity, unspecified: Secondary | ICD-10-CM | POA: Diagnosis not present

## 2021-10-08 DIAGNOSIS — N183 Chronic kidney disease, stage 3 unspecified: Secondary | ICD-10-CM

## 2021-10-08 DIAGNOSIS — E538 Deficiency of other specified B group vitamins: Secondary | ICD-10-CM | POA: Diagnosis not present

## 2021-10-08 DIAGNOSIS — G4700 Insomnia, unspecified: Secondary | ICD-10-CM | POA: Insufficient documentation

## 2021-10-08 LAB — COMPREHENSIVE METABOLIC PANEL
ALT: 11 U/L (ref 0–35)
AST: 14 U/L (ref 0–37)
Albumin: 4.6 g/dL (ref 3.5–5.2)
Alkaline Phosphatase: 70 U/L (ref 39–117)
BUN: 29 mg/dL — ABNORMAL HIGH (ref 6–23)
CO2: 27 mEq/L (ref 19–32)
Calcium: 9.8 mg/dL (ref 8.4–10.5)
Chloride: 104 mEq/L (ref 96–112)
Creatinine, Ser: 1.3 mg/dL — ABNORMAL HIGH (ref 0.40–1.20)
GFR: 44.25 mL/min — ABNORMAL LOW (ref >60.00)
Glucose, Bld: 88 mg/dL (ref 70–99)
Potassium: 4.4 mEq/L (ref 3.5–5.1)
Sodium: 138 mEq/L (ref 135–145)
Total Bilirubin: 0.6 mg/dL (ref 0.2–1.2)
Total Protein: 7.5 g/dL (ref 6.0–8.3)

## 2021-10-08 LAB — HEMOGLOBIN A1C: Hgb A1c MFr Bld: 5.7 % (ref 4.6–6.5)

## 2021-10-08 LAB — TSH: TSH: 0.9 u[IU]/mL (ref 0.35–5.50)

## 2021-10-08 LAB — T4, FREE: Free T4: 1.26 ng/dL (ref 0.60–1.60)

## 2021-10-08 MED ORDER — NURTEC 75 MG PO TBDP: ORAL_TABLET | ORAL | 5 refills | Status: DC

## 2021-10-08 MED ORDER — ZOLPIDEM TARTRATE 10 MG PO TABS: 5.0000 mg | ORAL_TABLET | Freq: Every evening | ORAL | 3 refills | Status: DC | PRN

## 2021-10-08 NOTE — Assessment & Plan Note (Signed)
Check TSH 

## 2021-10-08 NOTE — Progress Notes (Signed)
Subjective:  Patient ID: Autumn Howard, female    DOB: 1960/09/28  Age: 61 y.o. MRN: [id number]  CC: No chief complaint on file.   HPI [name] presents for breast cancer - new - s/p B mastectomy w/abd flap reconstruction. F/u DM, HAs C/o anxiety, insomnia   Outpatient Medications Prior to Visit  Medication Sig Dispense Refill   Butalbital-Acetaminophen (BUPAP) 50-300 MG TABS Take 1 tablet by mouth 2 (two) times daily as needed. 60 tablet 2   Cholecalciferol (VITAMIN D3) 2000 units capsule Take 1 capsule (2,000 Units total) by mouth daily. 100 capsule 3   Cyanocobalamin (VITAMIN B-12) 1000 MCG SUBL Place 1 tablet (1,000 mcg total) under the tongue daily. 100 tablet 3   levothyroxine (SYNTHROID) 125 MCG tablet Take 1 tablet by mouth daily. Take 1 by mouth daily     Magnesium Oxide (MAG-OXIDE PO) Take 400 mg by mouth 2 (two) times daily. Take 1 tablet twice a day     MOUNJARO 7.5 MG/0.5ML Pen Inject into the skin.     ondansetron (ZOFRAN) 4 MG tablet Take 1 tablet (4 mg total) by mouth every 8 (eight) hours as needed for nausea. 30 tablet 1   rosuvastatin (CRESTOR) 5 MG tablet Take 5 mg by mouth daily. Take 1 by mouth daily     Ubrogepant (UBRELVY) 100 MG TABS Take 100 mg by mouth daily as needed. 6 tablet 5   levothyroxine (SYNTHROID) 112 MCG tablet Take by mouth.     No facility-administered medications prior to visit.    ROS: Review of Systems  Constitutional:  Negative for activity change, appetite change, chills, fatigue and unexpected weight change.  HENT:  Negative for congestion, mouth sores and sinus pressure.   Eyes:  Negative for visual disturbance.  Respiratory:  Negative for cough and chest tightness.   Gastrointestinal:  Negative for abdominal pain and nausea.  Genitourinary:  Negative for difficulty urinating, frequency and vaginal pain.  Musculoskeletal:  Negative for back pain and gait problem.  Skin:  Negative for pallor and rash.  Neurological:   Negative for dizziness, tremors, weakness, numbness and headaches.  Psychiatric/Behavioral:  Positive for decreased concentration and sleep disturbance. Negative for confusion and suicidal ideas. The patient is nervous/anxious.     Objective:  BP 118/70 (BP Location: Left Arm, Patient Position: Sitting, Cuff Size: Normal)   Pulse (!) 53   Temp 98.1 F (36.7 C) (Oral)   Ht '5\' 9"'$  (1.753 m)   Wt 176 lb (79.8 kg)   LMP 12/22/2011   SpO2 92%   BMI 25.99 kg/m   BP Readings from Last 3 Encounters:  10/08/21 118/70  06/30/21 130/86  10/21/20 110/82    Wt Readings from Last 3 Encounters:  10/08/21 176 lb (79.8 kg)  06/30/21 179 lb (81.2 kg)  10/21/20 179 lb 3.2 oz (81.3 kg)    Physical Exam Constitutional:      General: She is not in acute distress.    Appearance: Normal appearance. She is well-developed.  HENT:     Head: Normocephalic.     Right Ear: External ear normal.     Left Ear: External ear normal.     Nose: Nose normal.  Eyes:     General:        Right eye: No discharge.        Left eye: No discharge.     Conjunctiva/sclera: Conjunctivae normal.     Pupils: Pupils are equal, round, and reactive to light.  Neck:     Thyroid: No thyromegaly.     Vascular: No JVD.     Trachea: No tracheal deviation.  Cardiovascular:     Rate and Rhythm: Normal rate and regular rhythm.     Heart sounds: Normal heart sounds.  Pulmonary:     Effort: No respiratory distress.     Breath sounds: No stridor. No wheezing.  Abdominal:     General: Bowel sounds are normal. There is no distension.     Palpations: Abdomen is soft. There is no mass.     Tenderness: There is no abdominal tenderness. There is no guarding or rebound.  Musculoskeletal:        General: No tenderness.     Cervical back: Normal range of motion and neck supple. No rigidity.  Lymphadenopathy:     Cervical: No cervical adenopathy.  Skin:    Findings: No erythema or rash.  Neurological:     Cranial Nerves: No  cranial nerve deficit.     Motor: No abnormal muscle tone.     Coordination: Coordination normal.     Deep Tendon Reflexes: Reflexes normal.  Psychiatric:        Behavior: Behavior normal.        Thought Content: Thought content normal.        Judgment: Judgment normal.     Lab Results  Component Value Date   WBC 6.0 07/29/2017   HGB 14.2 07/29/2017   HCT 40.8 07/29/2017   PLT 178.0 07/29/2017   GLUCOSE 90 06/30/2021   CHOL 252 (H) 06/22/2016   TRIG 340.0 (H) 06/22/2016   HDL 38.00 (L) 06/22/2016   LDLDIRECT 135.0 06/22/2016   LDLCALC 133 (H) 01/11/2012   ALT 8 06/30/2021   AST 13 06/30/2021   NA 139 06/30/2021   K 4.1 06/30/2021   CL 105 06/30/2021   CREATININE 0.99 06/30/2021   BUN 25 (H) 06/30/2021   CO2 26 06/30/2021   TSH 0.46 06/30/2021   INR 0.95 11/25/2009   HGBA1C 5.2 06/30/2021    CT Chest Wo Contrast  Result Date: 08/27/2021 CLINICAL DATA:  Abnormal xray - lung nodule, >= 1 cm f/u on RLL nodule on CT. Thx abnormal chest nodule in the right lower lobe, follow-up study EXAM: CT CHEST WITHOUT CONTRAST TECHNIQUE: Multidetector CT imaging of the chest was performed following the standard protocol without IV contrast. RADIATION DOSE REDUCTION: This exam was performed according to the departmental dose-optimization program which includes automated exposure control, adjustment of the mA and/or kV according to patient size and/or use of iterative reconstruction technique. COMPARISON:  CT of the chest dated June 11, 2021 from [facility]: Cardiovascular: No significant vascular findings. Normal heart size. No pericardial effusion. Mediastinum/Nodes: Previously described 1 cm right hilar lymph node is without significant interval change (image 61/2) no significant mass seen. Left hilar lymph node calcification. Thyroid lobes have a normal appearance. Esophagus is unremarkable. Lungs/Pleura: There is an 8 mm smooth marginated subpleural nodule in  the superior segment of the right lower lobe, stable. No new nodule seen. No consolidation or pleural effusion. Upper Abdomen: No acute abnormality.  Status post cholecystectomy. Musculoskeletal: No chest wall mass or suspicious bone lesions identified. Focal coarse calcification in the right breast. IMPRESSION: 8 mm smooth marginated subpleural nodule at the superior segment of the right lower lobe is stable. No new nodule seen. 1 cm right hilar lymph node is stable. Electronically Signed   By: Glori Bickers.D.  On: 08/27/2021 16:47    Assessment & Plan:   Problem List Items Addressed This Visit     B12 deficiency    On Vit B12      CRI (chronic renal insufficiency), stage 3 (moderate) ([facility])    Check GFR      Diabetes mellitus type 2 in obese (HCC)     Metformin 1000 mg po twice daily.  Ozempic weekly  D/c.  [facility]: Dr Gwynneth Macleod - A1c 5.8% -- 2022 On Mounjaro Check A1c      Relevant Medications   MOUNJARO 7.5 MG/0.5ML Pen   Other Relevant Orders   Comprehensive metabolic panel   T4, free   TSH   Hemoglobin A1c   Hypothyroidism    Check TSH      Relevant Orders   Comprehensive metabolic panel   T4, free   TSH   Insomnia    Sart Zolpidem prn  Potential benefits of a long term benzodiazepines  use as well as potential risks  and complications were explained to the patient and were aknowledged.       Migraine    Ubrelvy prn - d/c , not covered Start Nurtec      Relevant Medications   Rimegepant Sulfate (NURTEC) 75 MG TBDP      Meds ordered this encounter  Medications   zolpidem (AMBIEN) 10 MG tablet    Sig: Take 0.5-1 tablets (5-10 mg total) by mouth at bedtime as needed for sleep.    Dispense:  30 tablet    Refill:  3   Rimegepant Sulfate (NURTEC) 75 MG TBDP    Sig: 1 po qod prn    Dispense:  12 tablet    Refill:  5      Follow-up: Return in about 4 months (around 02/07/2022) for a follow-up visit.  Walker Kehr, MD

## 2021-10-08 NOTE — Assessment & Plan Note (Signed)
Check GFR 

## 2021-10-08 NOTE — Assessment & Plan Note (Addendum)
Metformin 1000 mg po twice daily.  Ozempic weekly  D/c.  [facility]: Dr Gwynneth Macleod - A1c 5.8% -- 2022 On Mounjaro Check A1c

## 2021-10-08 NOTE — Assessment & Plan Note (Signed)
On Vit B12 

## 2021-10-08 NOTE — Assessment & Plan Note (Signed)
Ubrelvy prn - d/c , not covered Start Nurtec

## 2021-10-08 NOTE — Assessment & Plan Note (Signed)
Sart Zolpidem prn  Potential benefits of a long term benzodiazepines  use as well as potential risks  and complications were explained to the patient and were aknowledged.

## 2021-11-26 DIAGNOSIS — G8918 Other acute postprocedural pain: Secondary | ICD-10-CM | POA: Insufficient documentation

## 2021-12-07 ENCOUNTER — Encounter: Payer: Self-pay | Admitting: Internal Medicine

## 2021-12-15 NOTE — Telephone Encounter (Signed)
Patient called in saying she was scheduled for a hsp follow up on 12/29/21, wondering if she can get worked in sooner as she is in a lot of pain.

## 2021-12-16 ENCOUNTER — Telehealth (INDEPENDENT_AMBULATORY_CARE_PROVIDER_SITE_OTHER): Payer: PRIVATE HEALTH INSURANCE | Admitting: Internal Medicine

## 2021-12-16 DIAGNOSIS — I2699 Other pulmonary embolism without acute cor pulmonale: Secondary | ICD-10-CM

## 2021-12-16 DIAGNOSIS — R0781 Pleurodynia: Secondary | ICD-10-CM

## 2021-12-16 MED ORDER — OXYCODONE-ACETAMINOPHEN 5-325 MG PO TABS: 0.5000 | ORAL_TABLET | Freq: Four times a day (QID) | ORAL | 0 refills | Status: DC | PRN

## 2021-12-16 MED ORDER — METHYLPREDNISOLONE 4 MG PO TBPK: ORAL_TABLET | ORAL | 0 refills | Status: DC

## 2021-12-21 ENCOUNTER — Encounter: Payer: Self-pay | Admitting: Internal Medicine

## 2021-12-21 DIAGNOSIS — I2699 Other pulmonary embolism without acute cor pulmonale: Secondary | ICD-10-CM | POA: Insufficient documentation

## 2021-12-21 DIAGNOSIS — R0781 Pleurodynia: Secondary | ICD-10-CM | POA: Insufficient documentation

## 2021-12-21 NOTE — Assessment & Plan Note (Signed)
New.  Right lower lobe On Eliquis

## 2021-12-21 NOTE — Assessment & Plan Note (Signed)
New-likely due to recent PE Prescribed oxycodone/APAP as needed Prednisone Dosepak to reduce pleural inflammation  Potential benefits of a short term opioids use as well as potential risks (i.e. addiction risk, apnea etc) and complications (i.e. Somnolence, constipation and others) were explained to the patient and were aknowledged.  Potential benefits of a short term amphetamines  use as well as potential risks  and complications were explained to the patient and were aknowledged.

## 2021-12-21 NOTE — Progress Notes (Signed)
Virtual Visit via Video Note  I connected with Autumn Howard on 12/21/21 at  4:00 PM EDT by a video enabled telemedicine application and verified that I am speaking with the correct person using two identifiers.   I discussed the limitations of evaluation and management by telemedicine and the availability of in person appointments. The patient expressed understanding and agreed to proceed.  I was located at our [facility] office. The patient was at home. There was no one else present in the visit.  No chief complaint on file.    History of Present Illness:  Autumn Howard presents for PE follow-up. C/o substantial L CP 8-10/10. On Eliquis.  There is shortness of breath due to chest pain.  No syncope.   CT angio Impression9/29/23   1.  Pulmonary embolism is present within the right lower lobe with nonocclusive thrombus within a segmental artery and additional scattered likely occlusive thrombus within subsegmental branches. No evidence of right heart strain.  2.  Atelectasis in the inferior portions of the bilateral lower lobes, including the region of right lower lobe pulmonary embolism, therefore the differential includes infarct in this area, however appearance is more typical of atelectasis.  3.  Area of groundglass opacities in the lingula, may represent atelectasis or infectious/inflammatory process.  4.  Surgical drains within the bilateral breasts soft tissues in this patient status post recent breast reconstruction surgery. Sterility indeterminate collection along the superior medial aspect of the left breast measuring 1.8 x 11.5 x 2.4 cm, possibly a postoperative seroma.   Review of Systems  Constitutional:  Negative for fever.  HENT:  Negative for congestion.   Eyes:  Negative for blurred vision.  Cardiovascular:  Positive for chest pain. Negative for orthopnea and leg swelling.  Genitourinary:  Negative for dysuria.  Musculoskeletal:  Negative for neck pain.   Psychiatric/Behavioral:  Positive for depression. The patient is not nervous/anxious and does not have insomnia.      Observations/Objective: The patient appears to be in no acute distress.  She appears tired.  Assessment and Plan:  Problem List Items Addressed This Visit     Chest pain, pleuritic    New-likely due to recent PE Prescribed oxycodone/APAP as needed Prednisone Dosepak to reduce pleural inflammation  Potential benefits of a short term opioids use as well as potential risks (i.e. addiction risk, apnea etc) and complications (i.e. Somnolence, constipation and others) were explained to the patient and were aknowledged.  Potential benefits of a short term amphetamines  use as well as potential risks  and complications were explained to the patient and were aknowledged.      Pulmonary embolism (Autumn Howard)    New.  Right lower lobe On Eliquis        Meds ordered this encounter  Medications   oxyCODONE-acetaminophen (PERCOCET/ROXICET) 5-325 MG tablet    Sig: Take 0.5-1 tablets by mouth every 6 (six) hours as needed for severe pain.    Dispense:  20 tablet    Refill:  0   methylPREDNISolone (MEDROL DOSEPAK) 4 MG TBPK tablet    Sig: As directed    Dispense:  21 tablet    Refill:  0     Follow Up Instructions:    I discussed the assessment and treatment plan with the patient. The patient was provided an opportunity to ask questions and all were answered. The patient agreed with the plan and demonstrated an understanding of the instructions.   The patient was advised to call back  or seek an in-person evaluation if the symptoms worsen or if the condition fails to improve as anticipated.  I provided face-to-face time during this encounter. We were at different locations.   Walker Kehr, MD

## 2021-12-29 ENCOUNTER — Ambulatory Visit: Payer: PRIVATE HEALTH INSURANCE | Admitting: Internal Medicine

## 2021-12-29 ENCOUNTER — Encounter: Payer: Self-pay | Admitting: Internal Medicine

## 2021-12-29 VITALS — BP 92/68 | HR 87 | Temp 98.0°F | Ht 69.0 in | Wt 176.0 lb

## 2021-12-29 DIAGNOSIS — R0781 Pleurodynia: Secondary | ICD-10-CM | POA: Diagnosis not present

## 2021-12-29 DIAGNOSIS — I2699 Other pulmonary embolism without acute cor pulmonale: Secondary | ICD-10-CM | POA: Diagnosis not present

## 2021-12-29 MED ORDER — OXYCODONE-ACETAMINOPHEN 5-325 MG PO TABS: 0.5000 | ORAL_TABLET | ORAL | 0 refills | Status: DC | PRN

## 2021-12-29 MED ORDER — METHYLPREDNISOLONE 4 MG PO TBPK: ORAL_TABLET | ORAL | 0 refills | Status: DC

## 2021-12-29 NOTE — Assessment & Plan Note (Signed)
Check LE [place]

## 2021-12-29 NOTE — Progress Notes (Signed)
Subjective:  Patient ID: Autumn Howard, female    DOB: [birth year]  Age: 62 y.o. MRN: [id number]  CC: Follow-up (HOSP F/U)   HPI Autumn Howard presents for L anterior chest pain 10/10 under L breast irrad to the back - spasms, relieved w/Oxy - 4 h complete relief w/any dose Steroids helped  Surgery 9/21 Cough 9/27 L CP 9/27 -- PE R  CT angio Impression 12/04/21    1.  Pulmonary embolism is present within the right lower lobe with nonocclusive thrombus within a segmental artery and additional scattered likely occlusive thrombus within subsegmental branches. No evidence of right heart strain.  2.  Atelectasis in the inferior portions of the bilateral lower lobes, including the region of right lower lobe pulmonary embolism, therefore the differential includes infarct in this area, however appearance is more typical of atelectasis.  3.  Area of groundglass opacities in the lingula, may represent atelectasis or infectious/inflammatory process.  4.  Surgical drains within the bilateral breasts soft tissues in this patient status post recent breast reconstruction surgery. Sterility indeterminate collection along the superior medial aspect of the left breast measuring 1.8 x 11.5 x 2.4 cm, possibly a postoperative seroma.     Outpatient Medications Prior to Visit  Medication Sig Dispense Refill   Butalbital-Acetaminophen (BUPAP) 50-300 MG TABS Take 1 tablet by mouth 2 (two) times daily as needed. 60 tablet 2   Cholecalciferol (VITAMIN D3) 2000 units capsule Take 1 capsule (2,000 Units total) by mouth daily. 100 capsule 3   Cyanocobalamin (VITAMIN B-12) 1000 MCG SUBL Place 1 tablet (1,000 mcg total) under the tongue daily. 100 tablet 3   ELIQUIS 5 MG TABS tablet Take 10 mg by mouth 2 (two) times daily. TAKE 1 TWICE A DAY     levothyroxine (SYNTHROID) 125 MCG tablet Take 1 tablet by mouth daily. Take 1 by mouth daily     Magnesium Oxide (MAG-OXIDE PO) Take 400 mg by mouth 2 (two) times daily.  Take 1 tablet twice a day     MOUNJARO 7.5 MG/0.5ML Pen Inject into the skin.     ondansetron (ZOFRAN) 4 MG tablet Take 1 tablet (4 mg total) by mouth every 8 (eight) hours as needed for nausea. 30 tablet 1   Rimegepant Sulfate (NURTEC) 75 MG TBDP 1 po qod prn 12 tablet 5   rosuvastatin (CRESTOR) 5 MG tablet Take 5 mg by mouth daily. Take 1 by mouth daily     Ubrogepant (UBRELVY) 100 MG TABS Take 100 mg by mouth daily as needed. 6 tablet 5   zolpidem (AMBIEN) 10 MG tablet Take 0.5-1 tablets (5-10 mg total) by mouth at bedtime as needed for sleep. 30 tablet 3   methylPREDNISolone (MEDROL DOSEPAK) 4 MG TBPK tablet As directed 21 tablet 0   oxyCODONE-acetaminophen (PERCOCET/ROXICET) 5-325 MG tablet Take 0.5-1 tablets by mouth every 6 (six) hours as needed for severe pain. 20 tablet 0   No facility-administered medications prior to visit.    ROS: Review of Systems  Constitutional:  Negative for activity change, appetite change, chills, fatigue and unexpected weight change.  HENT:  Negative for congestion, mouth sores and sinus pressure.   Eyes:  Negative for visual disturbance.  Respiratory:  Negative for cough and chest tightness.   Cardiovascular:  Positive for chest pain.  Gastrointestinal:  Negative for abdominal pain and nausea.  Genitourinary:  Negative for difficulty urinating, frequency and vaginal pain.  Musculoskeletal:  Negative for back pain and gait problem.  Skin:  Negative for pallor and rash.  Neurological:  Negative for dizziness, tremors, weakness, numbness and headaches.  Psychiatric/Behavioral:  Positive for sleep disturbance. Negative for confusion and suicidal ideas.     Objective:  BP 92/68 (BP Location: Left Arm)   Pulse 87   Temp 98 F (36.7 C) (Oral)   Ht '5\' 9"'$  (1.753 m)   Wt 176 lb (79.8 kg)   LMP 12/22/2011   SpO2 95%   BMI 25.99 kg/m   BP Readings from Last 3 Encounters:  12/29/21 92/68  10/08/21 118/70  06/30/21 130/86    Wt Readings from Last 3  Encounters:  12/29/21 176 lb (79.8 kg)  10/08/21 176 lb (79.8 kg)  06/30/21 179 lb (81.2 kg)    Physical Exam Constitutional:      General: She is not in acute distress.    Appearance: She is well-developed.  HENT:     Head: Normocephalic.     Right Ear: External ear normal.     Left Ear: External ear normal.     Nose: Nose normal.  Eyes:     General:        Right eye: No discharge.        Left eye: No discharge.     Conjunctiva/sclera: Conjunctivae normal.     Pupils: Pupils are equal, round, and reactive to light.  Neck:     Thyroid: No thyromegaly.     Vascular: No JVD.     Trachea: No tracheal deviation.  Cardiovascular:     Rate and Rhythm: Normal rate and regular rhythm.     Heart sounds: Normal heart sounds.  Pulmonary:     Effort: No respiratory distress.     Breath sounds: No stridor. No wheezing.  Abdominal:     General: Bowel sounds are normal. There is no distension.     Palpations: Abdomen is soft. There is no mass.     Tenderness: There is no abdominal tenderness. There is no guarding or rebound.  Musculoskeletal:        General: Tenderness present.     Cervical back: Normal range of motion and neck supple. No rigidity.  Lymphadenopathy:     Cervical: No cervical adenopathy.  Skin:    Findings: No erythema or rash.  Neurological:     Cranial Nerves: No cranial nerve deficit.     Motor: No abnormal muscle tone.     Coordination: Coordination normal.     Deep Tendon Reflexes: Reflexes normal.  Psychiatric:        Behavior: Behavior normal.        Thought Content: Thought content normal.        Judgment: Judgment normal.     Lab Results  Component Value Date   WBC 6.0 07/29/2017   HGB 14.2 07/29/2017   HCT 40.8 07/29/2017   PLT 178.0 07/29/2017   GLUCOSE 88 10/08/2021   CHOL 252 (H) 06/22/2016   TRIG 340.0 (H) 06/22/2016   HDL 38.00 (L) 06/22/2016   LDLDIRECT 135.0 06/22/2016   LDLCALC 133 (H) 01/11/2012   ALT 11 10/08/2021   AST 14  10/08/2021   NA 138 10/08/2021   K 4.4 10/08/2021   CL 104 10/08/2021   CREATININE 1.30 (H) 10/08/2021   BUN 29 (H) 10/08/2021   CO2 27 10/08/2021   TSH 0.90 10/08/2021   INR 0.95 11/25/2009   HGBA1C 5.7 10/08/2021    CT Chest Wo Contrast  Result Date: 08/27/2021 CLINICAL DATA:  Abnormal xray - lung  nodule, >= 1 cm f/u on RLL nodule on CT. Thx abnormal chest nodule in the right lower lobe, follow-up study EXAM: CT CHEST WITHOUT CONTRAST TECHNIQUE: Multidetector CT imaging of the chest was performed following the standard protocol without IV contrast. RADIATION DOSE REDUCTION: This exam was performed according to the departmental dose-optimization program which includes automated exposure control, adjustment of the mA and/or kV according to patient size and/or use of iterative reconstruction technique. COMPARISON:  CT of the chest dated June 11, 2021 from [place]: Cardiovascular: No significant vascular findings. Normal heart size. No pericardial effusion. Mediastinum/Nodes: Previously described 1 cm right hilar lymph node is without significant interval change (image 61/2) no significant mass seen. Left hilar lymph node calcification. Thyroid lobes have a normal appearance. Esophagus is unremarkable. Lungs/Pleura: There is an 8 mm smooth marginated subpleural nodule in the superior segment of the right lower lobe, stable. No new nodule seen. No consolidation or pleural effusion. Upper Abdomen: No acute abnormality.  Status post cholecystectomy. Musculoskeletal: No chest wall mass or suspicious bone lesions identified. Focal coarse calcification in the right breast. IMPRESSION: 8 mm smooth marginated subpleural nodule at the superior segment of the right lower lobe is stable. No new nodule seen. 1 cm right hilar lymph node is stable. Electronically Signed   By: Frazier Richards M.D.   On: 08/27/2021 16:47    Assessment & Plan:   Problem List Items Addressed This  Visit     Chest pain, pleuritic - Primary    L CP (PE is on the R) Poss MSK due to cough, hematoma? Doubt another PE  CT angio Impression 12/04/21    1.  Pulmonary embolism is present within the right lower lobe with nonocclusive thrombus within a segmental artery and additional scattered likely occlusive thrombus within subsegmental branches. No evidence of right heart strain.  2.  Atelectasis in the inferior portions of the bilateral lower lobes, including the region of right lower lobe pulmonary embolism, therefore the differential includes infarct in this area, however appearance is more typical of atelectasis.  3.  Area of groundglass opacities in the lingula, may represent atelectasis or infectious/inflammatory process.  4.  Surgical drains within the bilateral breasts soft tissues in this patient status post recent breast reconstruction surgery. Sterility indeterminate collection along the superior medial aspect of the left breast measuring 1.8 x 11.5 x 2.4 cm, possibly a postoperative seroma. mmorrhage  CXR or chest CT if CP   Oxy prn  Potential benefits of a short term opioids use as well as potential risks (i.e. addiction risk, apnea etc) and complications (i.e. Somnolence, constipation and others) were explained to the patient and were aknowledged. Repeat medrol  RTC 4 wks. RTC soon if not well      Pulmonary embolism ([facility])    Check LE [place]      Relevant Medications   ELIQUIS 5 MG TABS tablet   Other Relevant Orders   VAS [place] LOWER EXTREMITY VENOUS (DVT)      Meds ordered this encounter  Medications   oxyCODONE-acetaminophen (PERCOCET/ROXICET) 5-325 MG tablet    Sig: Take 0.5-1 tablets by mouth every 4 (four) hours as needed for severe pain.    Dispense:  100 tablet    Refill:  0   methylPREDNISolone (MEDROL DOSEPAK) 4 MG TBPK tablet    Sig: As directed    Dispense:  21 tablet    Refill:  0      Follow-up: Return in  about 4 weeks (around 01/26/2022) for a  follow-up visit.  Walker Kehr, MD

## 2021-12-29 NOTE — Assessment & Plan Note (Addendum)
L CP (PE is on the R) Poss MSK due to cough, hematoma? Doubt another PE  CT angio Impression 12/04/21   1. Pulmonary embolism is present within the right lower lobe with nonocclusive thrombus within a segmental artery and additional scattered likely occlusive thrombus within subsegmental branches. No evidence of right heart strain.  2. Atelectasis in the inferior portions of the bilateral lower lobes, including the region of right lower lobe pulmonary embolism, therefore the differential includes infarct in this area, however appearance is more typical of atelectasis.  3. Area of groundglass opacities in the lingula, may represent atelectasis or infectious/inflammatory process.  4. Surgical drains within the bilateral breasts soft tissues in this patient status post recent breast reconstruction surgery. Sterility indeterminate collection along the superior medial aspect of the left breast measuring 1.8 x 11.5 x 2.4 cm, possibly a postoperative seroma. mmorrhage  CXR or chest CT if CP   Oxy prn  Potential benefits of a short term opioids use as well as potential risks (i.e. addiction risk, apnea etc) and complications (i.e. Somnolence, constipation and others) were explained to the patient and were aknowledged. Repeat medrol  RTC 4 wks. RTC soon if not well

## 2022-01-05 ENCOUNTER — Ambulatory Visit (HOSPITAL_COMMUNITY)
Admission: RE | Admit: 2022-01-05 | Discharge: 2022-01-05 | Disposition: A | Discharge status: Home / Self Care | Payer: PRIVATE HEALTH INSURANCE | Source: Ambulatory Visit | Attending: Internal Medicine | Admitting: Internal Medicine

## 2022-01-05 ENCOUNTER — Telehealth: Payer: Self-pay

## 2022-01-05 DIAGNOSIS — I2699 Other pulmonary embolism without acute cor pulmonale: Secondary | ICD-10-CM | POA: Diagnosis not present

## 2022-01-05 NOTE — Telephone Encounter (Signed)
Autumn Howard from [facility] called in a report for DVT, venous bilateral is negative. Patient will be sent home. [place] informed

## 2022-01-26 ENCOUNTER — Other Ambulatory Visit (INDEPENDENT_AMBULATORY_CARE_PROVIDER_SITE_OTHER): Payer: PRIVATE HEALTH INSURANCE

## 2022-01-26 ENCOUNTER — Telehealth: Payer: Self-pay | Admitting: Internal Medicine

## 2022-01-26 ENCOUNTER — Encounter: Payer: Self-pay | Admitting: Internal Medicine

## 2022-01-26 ENCOUNTER — Ambulatory Visit (INDEPENDENT_AMBULATORY_CARE_PROVIDER_SITE_OTHER): Payer: PRIVATE HEALTH INSURANCE | Admitting: Internal Medicine

## 2022-01-26 VITALS — BP 110/76 | HR 95 | Temp 98.4°F | Ht 69.0 in | Wt 168.6 lb

## 2022-01-26 DIAGNOSIS — F4323 Adjustment disorder with mixed anxiety and depressed mood: Secondary | ICD-10-CM | POA: Diagnosis not present

## 2022-01-26 DIAGNOSIS — R0781 Pleurodynia: Secondary | ICD-10-CM

## 2022-01-26 DIAGNOSIS — E034 Atrophy of thyroid (acquired): Secondary | ICD-10-CM

## 2022-01-26 DIAGNOSIS — E1169 Type 2 diabetes mellitus with other specified complication: Secondary | ICD-10-CM

## 2022-01-26 DIAGNOSIS — E669 Obesity, unspecified: Secondary | ICD-10-CM

## 2022-01-26 DIAGNOSIS — I2699 Other pulmonary embolism without acute cor pulmonale: Secondary | ICD-10-CM

## 2022-01-26 LAB — HEMOGLOBIN A1C: Hgb A1c MFr Bld: 6.7 % — ABNORMAL HIGH (ref 4.6–6.5)

## 2022-01-26 LAB — CBC WITH DIFFERENTIAL/PLATELET
Basophils Absolute: 0.1 10*3/uL (ref 0.0–0.1)
Basophils Relative: 0.7 % (ref 0.0–3.0)
Eosinophils Absolute: 0.1 10*3/uL (ref 0.0–0.7)
Eosinophils Relative: 0.9 % (ref 0.0–5.0)
HCT: 32.2 % — ABNORMAL LOW (ref 36.0–46.0)
Hemoglobin: 10.5 g/dL — ABNORMAL LOW (ref 12.0–15.0)
Lymphocytes Relative: 18.9 % (ref 12.0–46.0)
Lymphs Abs: 1.9 10*3/uL (ref 0.7–4.0)
MCHC: 32.6 g/dL (ref 30.0–36.0)
MCV: 82.7 fl (ref 78.0–100.0)
Monocytes Absolute: 0.7 10*3/uL (ref 0.1–1.0)
Monocytes Relative: 7 % (ref 3.0–12.0)
Neutro Abs: 7.2 10*3/uL (ref 1.4–7.7)
Neutrophils Relative %: 72.5 % (ref 43.0–77.0)
Platelets: 393 10*3/uL (ref 150.0–400.0)
RBC: 3.89 Mil/uL (ref 3.87–5.11)
RDW: 15.7 % — ABNORMAL HIGH (ref 11.5–15.5)
WBC: 9.9 10*3/uL (ref 4.0–10.5)

## 2022-01-26 MED ORDER — ELIQUIS 5 MG PO TABS: 10.0000 mg | ORAL_TABLET | Freq: Two times a day (BID) | ORAL | 3 refills | Status: DC

## 2022-01-26 NOTE — Assessment & Plan Note (Addendum)
Monitor A1c On Mounjaro  7.5 mg/wk only

## 2022-01-26 NOTE — Telephone Encounter (Signed)
Pharmacist called needing clarification on the Eliquis medication sent in. Best callback number is [phone].

## 2022-01-26 NOTE — Progress Notes (Signed)
Subjective:  Patient ID: Autumn Howard, female    DOB: Feb 03, 1961  Age: 61 y.o. MRN: [id number]  CC: Follow-up (4 week f/u)   HPI Autumn Howard presents for pleuritis - better; pt stopped Oxy. Seeing Pain Management for CP, LBP - Dr Sherrilee Gilles, Xanaflex. Follow-up on diabetes  Outpatient Medications Prior to Visit  Medication Sig Dispense Refill   Cholecalciferol (VITAMIN D3) 2000 units capsule Take 1 capsule (2,000 Units total) by mouth daily. 100 capsule 3   Cyanocobalamin (VITAMIN B-12) 1000 MCG SUBL Place 1 tablet (1,000 mcg total) under the tongue daily. 100 tablet 3   levothyroxine (SYNTHROID) 125 MCG tablet Take 1 tablet by mouth daily. Take 1 by mouth daily     Magnesium Oxide (MAG-OXIDE PO) Take 400 mg by mouth 2 (two) times daily. Take 1 tablet twice a day     MOUNJARO 7.5 MG/0.5ML Pen Inject into the skin.     ondansetron (ZOFRAN) 4 MG tablet Take 1 tablet (4 mg total) by mouth every 8 (eight) hours as needed for nausea. 30 tablet 1   pregabalin (LYRICA) 25 MG capsule Take 25 mg by mouth daily.     Rimegepant Sulfate (NURTEC) 75 MG TBDP 1 po qod prn 12 tablet 5   rosuvastatin (CRESTOR) 5 MG tablet Take 5 mg by mouth daily. Take 1 by mouth daily     tiZANidine (ZANAFLEX) 4 MG tablet Take 4 mg by mouth 2 (two) times daily as needed.     ELIQUIS 5 MG TABS tablet Take 10 mg by mouth 2 (two) times daily. TAKE 1 TWICE A DAY     oxyCODONE-acetaminophen (PERCOCET/ROXICET) 5-325 MG tablet Take 0.5-1 tablets by mouth every 4 (four) hours as needed for severe pain. 100 tablet 0   Butalbital-Acetaminophen (BUPAP) 50-300 MG TABS Take 1 tablet by mouth 2 (two) times daily as needed. (Patient not taking: Reported on 01/26/2022) 60 tablet 2   methylPREDNISolone (MEDROL DOSEPAK) 4 MG TBPK tablet As directed (Patient not taking: Reported on 01/26/2022) 21 tablet 0   Ubrogepant (UBRELVY) 100 MG TABS Take 100 mg by mouth daily as needed. (Patient not taking: Reported on 01/26/2022) 6  tablet 5   zolpidem (AMBIEN) 10 MG tablet Take 0.5-1 tablets (5-10 mg total) by mouth at bedtime as needed for sleep. (Patient not taking: Reported on 01/26/2022) 30 tablet 3   No facility-administered medications prior to visit.    ROS: Review of Systems  Constitutional:  Negative for activity change, appetite change, chills, fatigue and unexpected weight change.  HENT:  Negative for congestion, mouth sores and sinus pressure.   Eyes:  Negative for visual disturbance.  Respiratory:  Negative for cough and chest tightness.   Cardiovascular:  Positive for chest pain. Negative for palpitations and leg swelling.  Gastrointestinal:  Negative for abdominal pain and nausea.  Genitourinary:  Negative for difficulty urinating, frequency and vaginal pain.  Musculoskeletal:  Positive for back pain. Negative for gait problem.  Skin:  Negative for pallor and rash.  Neurological:  Negative for dizziness, tremors, weakness, numbness and headaches.  Psychiatric/Behavioral:  Negative for confusion and sleep disturbance. The patient is not nervous/anxious.     Objective:  BP 110/76 (BP Location: Left Arm)   Pulse 95   Temp 98.4 F (36.9 C) (Oral)   Ht '5\' 9"'$  (1.753 m)   Wt 168 lb 9.6 oz (76.5 kg)   LMP 12/22/2011   SpO2 94%   BMI 24.90 kg/m   BP Readings from  Last 3 Encounters:  01/26/22 110/76  12/29/21 92/68  10/08/21 118/70    Wt Readings from Last 3 Encounters:  01/26/22 168 lb 9.6 oz (76.5 kg)  12/29/21 176 lb (79.8 kg)  10/08/21 176 lb (79.8 kg)    Physical Exam Constitutional:      General: She is not in acute distress.    Appearance: She is well-developed. She is obese.  HENT:     Head: Normocephalic.     Right Ear: External ear normal.     Left Ear: External ear normal.     Nose: Nose normal.  Eyes:     General:        Right eye: No discharge.        Left eye: No discharge.     Conjunctiva/sclera: Conjunctivae normal.     Pupils: Pupils are equal, round, and reactive  to light.  Neck:     Thyroid: No thyromegaly.     Vascular: No JVD.     Trachea: No tracheal deviation.  Cardiovascular:     Rate and Rhythm: Normal rate and regular rhythm.     Heart sounds: Normal heart sounds.  Pulmonary:     Effort: No respiratory distress.     Breath sounds: No stridor. No wheezing.  Abdominal:     General: Bowel sounds are normal. There is no distension.     Palpations: Abdomen is soft. There is no mass.     Tenderness: There is no abdominal tenderness. There is no guarding or rebound.  Musculoskeletal:        General: No tenderness.     Cervical back: Normal range of motion and neck supple. No rigidity.  Lymphadenopathy:     Cervical: No cervical adenopathy.  Skin:    Findings: No erythema or rash.  Neurological:     Cranial Nerves: No cranial nerve deficit.     Motor: No abnormal muscle tone.     Coordination: Coordination normal.     Deep Tendon Reflexes: Reflexes normal.  Psychiatric:        Behavior: Behavior normal.        Thought Content: Thought content normal.        Judgment: Judgment normal.     Lab Results  Component Value Date   WBC 9.9 01/26/2022   HGB 10.5 (L) 01/26/2022   HCT 32.2 (L) 01/26/2022   PLT 393.0 01/26/2022   GLUCOSE 107 (H) 01/26/2022   CHOL 252 (H) 06/22/2016   TRIG 340.0 (H) 06/22/2016   HDL 38.00 (L) 06/22/2016   LDLDIRECT 135.0 06/22/2016   LDLCALC 133 (H) 01/11/2012   ALT 10 01/26/2022   AST 10 01/26/2022   NA 137 01/26/2022   K 4.2 01/26/2022   CL 99 01/26/2022   CREATININE 0.84 01/26/2022   BUN 17 01/26/2022   CO2 27 01/26/2022   TSH 5.81 (H) 01/26/2022   INR 0.95 11/25/2009   HGBA1C 6.7 (H) 01/26/2022    VAS [place] LOWER EXTREMITY VENOUS (DVT)  Result Date: 01/05/2022  Lower Venous DVT Study Patient Name:  Autumn Howard  Date of Exam:   01/05/2022 Medical Rec #: [id number]        Accession #:    [id number] Date of Birth: Jul 11, 1960        Patient Gender: F Patient Age:   73 years Exam Location:  [facility] Procedure:      VAS [place] LOWER EXTREMITY VENOUS (DVT) Referring Phys: Tyrone Apple Dhamar Gregory --------------------------------------------------------------------------------  Indications: Acute  PE.  Risk Factors: Surgery 11/26/2021 Diep Flap. Breast reconstruction Anticoagulation: Eliquis. Comparison Study: No prior exam Performing Technologist: Alvia Grove RVT  Examination Guidelines: A complete evaluation includes B-mode imaging, spectral Doppler, color Doppler, and power Doppler as needed of all accessible portions of each vessel. Bilateral testing is considered an integral part of a complete examination. Limited examinations for reoccurring indications may be performed as noted. The reflux portion of the exam is performed with the patient in reverse Trendelenburg.  +---------+---------------+---------+-----------+----------+--------------+ RIGHT    CompressibilityPhasicitySpontaneityPropertiesThrombus Aging +---------+---------------+---------+-----------+----------+--------------+ CFV      Full           Yes      Yes                                 +---------+---------------+---------+-----------+----------+--------------+ SFJ      Full           Yes      Yes                                 +---------+---------------+---------+-----------+----------+--------------+ FV Prox  Full           Yes      Yes                                 +---------+---------------+---------+-----------+----------+--------------+ FV Mid   Full           Yes      Yes                                 +---------+---------------+---------+-----------+----------+--------------+ FV DistalFull           Yes      Yes                                 +---------+---------------+---------+-----------+----------+--------------+ PFV      Full           Yes      Yes                                 +---------+---------------+---------+-----------+----------+--------------+ POP       Full           Yes      Yes                                 +---------+---------------+---------+-----------+----------+--------------+ PTV      Full           Yes      Yes                                 +---------+---------------+---------+-----------+----------+--------------+ PERO     Full           Yes      Yes                                 +---------+---------------+---------+-----------+----------+--------------+ Gastroc  Full  Yes      Yes                                 +---------+---------------+---------+-----------+----------+--------------+ GSV      Full           Yes      Yes                                 +---------+---------------+---------+-----------+----------+--------------+ SSV      Full           Yes      Yes                                 +---------+---------------+---------+-----------+----------+--------------+   +---------+---------------+---------+-----------+----------+--------------+ LEFT     CompressibilityPhasicitySpontaneityPropertiesThrombus Aging +---------+---------------+---------+-----------+----------+--------------+ CFV      Full           Yes      Yes                                 +---------+---------------+---------+-----------+----------+--------------+ SFJ      Full           Yes      Yes                                 +---------+---------------+---------+-----------+----------+--------------+ FV Prox  Full           Yes      Yes                                 +---------+---------------+---------+-----------+----------+--------------+ FV Mid   Full           Yes      Yes                                 +---------+---------------+---------+-----------+----------+--------------+ FV DistalFull           Yes      Yes                                 +---------+---------------+---------+-----------+----------+--------------+ PFV      Full           Yes      Yes                                  +---------+---------------+---------+-----------+----------+--------------+ POP      Full           Yes      Yes                                 +---------+---------------+---------+-----------+----------+--------------+ PTV      Full           Yes      Yes                                 +---------+---------------+---------+-----------+----------+--------------+  PERO     Full           Yes      Yes                                 +---------+---------------+---------+-----------+----------+--------------+ Gastroc  Full           Yes      Yes                                 +---------+---------------+---------+-----------+----------+--------------+ GSV      Full           Yes      Yes                                 +---------+---------------+---------+-----------+----------+--------------+ SSV      Full           Yes      Yes                                 +---------+---------------+---------+-----------+----------+--------------+    Findings reported to [place] at 9:22.  Summary: RIGHT: - There is no evidence of deep vein thrombosis in the lower extremity. - There is no evidence of superficial venous thrombosis.  LEFT: - There is no evidence of deep vein thrombosis in the lower extremity. - There is no evidence of superficial venous thrombosis.  *See table(s) above for measurements and observations. Electronically signed by Monica Martinez MD on 01/05/2022 at 10:35:42 AM.    Final     Assessment & Plan:   Problem List Items Addressed This Visit     Pulmonary embolism ([facility])    Continue on Eliquis      Hypothyroidism   Relevant Orders   Comprehensive metabolic panel (Completed)   CBC with Differential/Platelet (Completed)   TSH (Completed)   T4, free (Completed)   Hemoglobin A1c (Completed)   Diabetes mellitus type 2 in obese (HCC) - Primary    Monitor A1c On Mounjaro  7.5 mg/wk only      Relevant Orders   Comprehensive metabolic  panel (Completed)   CBC with Differential/Platelet (Completed)   TSH (Completed)   T4, free (Completed)   Hemoglobin A1c (Completed)   Chest pain, pleuritic    Pain is better.  Continue with Lyrica, Zanaflex.  Follow-up with pain management center      Adjustment disorder with mixed anxiety and depressed mood    Frustrated  Discussed         Meds ordered this encounter  Medications   DISCONTD: ELIQUIS 5 MG TABS tablet    Sig: Take 2 tablets (10 mg total) by mouth 2 (two) times daily. TAKE 1 TWICE A DAY    Dispense:  60 tablet    Refill:  3      Follow-up: Return in about 3 months (around 04/28/2022) for a follow-up visit.  Walker Kehr, MD

## 2022-01-26 NOTE — Assessment & Plan Note (Signed)
Frustrated  Discussed

## 2022-01-27 LAB — TSH: TSH: 5.81 u[IU]/mL — ABNORMAL HIGH (ref 0.35–5.50)

## 2022-01-27 LAB — T4, FREE: Free T4: 1.31 ng/dL (ref 0.60–1.60)

## 2022-01-27 LAB — COMPREHENSIVE METABOLIC PANEL
ALT: 10 U/L (ref 0–35)
AST: 10 U/L (ref 0–37)
Albumin: 3.7 g/dL (ref 3.5–5.2)
Alkaline Phosphatase: 79 U/L (ref 39–117)
BUN: 17 mg/dL (ref 6–23)
CO2: 27 mEq/L (ref 19–32)
Calcium: 9.3 mg/dL (ref 8.4–10.5)
Chloride: 99 mEq/L (ref 96–112)
Creatinine, Ser: 0.84 mg/dL (ref 0.40–1.20)
GFR: 74.57 mL/min (ref >60.00)
Glucose, Bld: 107 mg/dL — ABNORMAL HIGH (ref 70–99)
Potassium: 4.2 mEq/L (ref 3.5–5.1)
Sodium: 137 mEq/L (ref 135–145)
Total Bilirubin: 0.3 mg/dL (ref 0.2–1.2)
Total Protein: 6.9 g/dL (ref 6.0–8.3)

## 2022-01-27 MED ORDER — ELIQUIS 5 MG PO TABS: 5.0000 mg | ORAL_TABLET | Freq: Two times a day (BID) | ORAL | 3 refills | Status: DC

## 2022-01-27 NOTE — Telephone Encounter (Signed)
Corrected Thx

## 2022-01-31 NOTE — Assessment & Plan Note (Signed)
Continue on Eliquis

## 2022-01-31 NOTE — Assessment & Plan Note (Signed)
Pain is better.  Continue with Lyrica, Zanaflex.  Follow-up with pain management center

## 2022-02-01 DIAGNOSIS — Z9889 Other specified postprocedural states: Secondary | ICD-10-CM | POA: Insufficient documentation

## 2022-02-08 ENCOUNTER — Ambulatory Visit: Payer: PRIVATE HEALTH INSURANCE | Admitting: Internal Medicine

## 2022-04-27 DIAGNOSIS — M5416 Radiculopathy, lumbar region: Secondary | ICD-10-CM | POA: Insufficient documentation

## 2022-04-27 DIAGNOSIS — M7061 Trochanteric bursitis, right hip: Secondary | ICD-10-CM | POA: Insufficient documentation

## 2022-04-27 DIAGNOSIS — M5431 Sciatica, right side: Secondary | ICD-10-CM | POA: Insufficient documentation

## 2022-04-28 ENCOUNTER — Encounter: Payer: Self-pay | Admitting: Internal Medicine

## 2022-04-28 ENCOUNTER — Ambulatory Visit: Payer: PRIVATE HEALTH INSURANCE | Admitting: Internal Medicine

## 2022-04-28 VITALS — BP 110/62 | Temp 97.9°F | Ht 69.0 in | Wt 156.0 lb

## 2022-04-28 DIAGNOSIS — F331 Major depressive disorder, recurrent, moderate: Secondary | ICD-10-CM

## 2022-04-28 DIAGNOSIS — I2699 Other pulmonary embolism without acute cor pulmonale: Secondary | ICD-10-CM | POA: Diagnosis not present

## 2022-04-28 DIAGNOSIS — E034 Atrophy of thyroid (acquired): Secondary | ICD-10-CM

## 2022-04-28 DIAGNOSIS — N183 Chronic kidney disease, stage 3 unspecified: Secondary | ICD-10-CM | POA: Diagnosis not present

## 2022-04-28 DIAGNOSIS — E1169 Type 2 diabetes mellitus with other specified complication: Secondary | ICD-10-CM

## 2022-04-28 DIAGNOSIS — D493 Neoplasm of unspecified behavior of breast: Secondary | ICD-10-CM

## 2022-04-28 DIAGNOSIS — E669 Obesity, unspecified: Secondary | ICD-10-CM

## 2022-04-28 DIAGNOSIS — E538 Deficiency of other specified B group vitamins: Secondary | ICD-10-CM

## 2022-04-28 NOTE — Assessment & Plan Note (Signed)
right lower lobe On Eliquis

## 2022-04-28 NOTE — Assessment & Plan Note (Signed)
.   On Mounjaro  7.5 mg/wk only

## 2022-04-28 NOTE — Assessment & Plan Note (Addendum)
See Occupational Health at work to assess fitness, ability to lift etc  S/p plastic surgery w/complications. Grew  out Pseudomonas - took Cipro. Wound closed in 04/2022 Hers (+)  S/p plastic surgery w/complications. Grew  out Pseudomonas - took Cipro. Wound closed in 04/2022 No further cancer Rx needed: Atypical lobular hyperplasia (ALH) and fibrocystic change.  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 If she receives BCT (lumpectomy) then will likely need whole breast radiation.  I printed her the pathology report and we went through it together today and discussed the implications  Prior to the visit today I discussed her case with Dr. Clovis Riley (surgeon) in our multidisciplinary breast cancer tumor board and our recommendation is to proceed with surgery  She is leaning towards a bilateral mastectomy and would like information on DIEP flap reconstruction  We discussed that if the invasive component is >5 mm on the final specimen then we will consider adjuvant HER2 directed therapy (likely paclitaxel with herceptin)  Discussed that typically 5 years of endocrine therapy is recommended however if the final pathology is just DCIS and she does have the bilateral mastectomy then we may consider NOT doing endocrine therapy as there is not a survival benefit in just DCIS. However if there does end up having a larger invasive area on the final pathology then we will certainly consider it  I will see her back a few weeks after her surgery to decide on next steps  Dr Clovis Riley: Autumn Howard is a 62 y.o. female received right breast biopsy- results showed microinvasive carcinoma in the background of high-grade DCIS. She underwent  bilateral mastectomy on 09/10/2021. Pathology showed left breast atypical lobular hyperplasia, no invasive carcinoma. Right mastectomy benign. 0/3 LN.   She is having a bit more pain yesterday at bilateral drain sites. Overall doing well. Output has decreased bilaterally, less than 30 cc over 2 consecutive days.   Drain removed during clinic visit with no complications.   PLAN:  -- Schedule to meet Dr Dollene Cleveland, [facility] (patient can cancel appt) -- RTC, Dr Clovis Riley in 1 year following no imaging  -- Referral to Plastic Surgery, Dr. Morley Kos for reconstruction  -- Call with any new questions or concerns    Diagnosis path 09/2022     A.  BREAST, LEFT, MASTECTOMY:               Atypical lobular hyperplasia (ALH) and fibrocystic change.              Fibroadenoma.              Microcalcifications present.              Benign nipple and skin.              No in-situ or invasive carcinoma identified.    B.  SENTINEL LYMPH NODE #1, RIGHT, EXCISION:                One (1) lymph node is negative for carcinoma (0/1).    C.  SENTINEL LYMPH NODE #2, RIGHT, EXCISION:              One (1) lymph node is negative for carcinoma (0/1).    D.  SENTINEL  LYMPH NODE #3, RIGHT, EXCISION:              One (1) lymph node is negative for carcinoma (0/1).    E.  BREAST, RIGHT, MASTECTOMY:               No in-situ or invasive carcinoma identified.               Biopsy sites change present.              Fibroadenomatoid change.              Sclerosing adenosis, usual type ductal hyperplasia, and fibrocystic change.              Microcalcifications present in non-neoplastic epithelium.              Benign nipple and skin.

## 2022-04-28 NOTE — Assessment & Plan Note (Signed)
Doing better.   

## 2022-04-28 NOTE — Progress Notes (Signed)
Subjective:  Patient ID: Autumn Howard, female    DOB: [birth year]  Age: 62 y.o. MRN: [id number]  CC: No chief complaint on file.   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 If she receives BCT (lumpectomy) then will likely need whole breast radiation.  I printed her the pathology report and we went through it together today and discussed the implications  Prior to the visit today I discussed her case with Dr. Clovis Riley (surgeon) in our multidisciplinary breast cancer tumor board and our recommendation is to proceed with surgery  She is leaning towards a bilateral mastectomy and would like information on DIEP flap reconstruction  We discussed that if the invasive component is >5 mm on the final specimen then we will consider adjuvant HER2 directed therapy (likely paclitaxel with herceptin)  Discussed that typically 5 years of endocrine therapy is recommended however if the final pathology is just DCIS and she does have the bilateral mastectomy then we may consider NOT doing endocrine therapy as there is not a survival benefit in just DCIS. However if there does end up having a larger invasive area on the final pathology then we will certainly consider it  I will see her back a few weeks after her surgery to decide on next steps  Dr Clovis Riley: Autumn Howard is a 62 y.o. female received right breast biopsy- results showed microinvasive carcinoma in the background of high-grade DCIS. She underwent bilateral mastectomy on 09/10/2021. Pathology showed left breast atypical lobular  hyperplasia, no invasive carcinoma. Right mastectomy benign. 0/3 LN.   She is having a bit more pain yesterday at bilateral drain sites. Overall doing well. Output has decreased bilaterally, less than 30 cc over 2 consecutive days.   Drain removed during clinic visit with no complications.   PLAN:  -- Schedule to meet Dr Dollene Cleveland, [facility] (patient can cancel appt) -- RTC, Dr Clovis Riley in 1 year following no imaging  -- Referral to Plastic Surgery, Dr. Morley Kos for reconstruction  -- Call with any new questions or concerns    Diagnosis path 09/2022     A.  BREAST, LEFT, MASTECTOMY:               Atypical lobular hyperplasia (ALH) and fibrocystic change.              Fibroadenoma.              Microcalcifications present.              Benign nipple and skin.              No in-situ or invasive carcinoma identified.    B.  SENTINEL LYMPH NODE #1, RIGHT, EXCISION:                One (1) lymph node is negative for carcinoma (0/1).    C.  SENTINEL LYMPH NODE #2, RIGHT, EXCISION:              One (1) lymph node is negative for carcinoma (0/1).    D.  SENTINEL LYMPH NODE #3, RIGHT,  EXCISION:              One (1) lymph node is negative for carcinoma (0/1).    E.  BREAST, RIGHT, MASTECTOMY:               No in-situ or invasive carcinoma identified.               Biopsy sites change present.              Fibroadenomatoid change.              Sclerosing adenosis, usual type ductal hyperplasia, and fibrocystic change.              Microcalcifications present in non-neoplastic epithelium.              Benign nipple and skin.       Outpatient Medications Prior to Visit  Medication Sig Dispense Refill   Baclofen 5 MG TABS Take 1 tablet by mouth 3 (three) times daily.     Cholecalciferol (VITAMIN D3) 2000 units capsule Take 1 capsule (2,000 Units total) by mouth daily. 100 capsule 3   Cyanocobalamin (VITAMIN B-12) 1000 MCG SUBL Place 1 tablet (1,000 mcg total) under the tongue daily. 100 tablet 3    ELIQUIS 5 MG TABS tablet Take 1 tablet (5 mg total) by mouth 2 (two) times daily. 60 tablet 3   levothyroxine (SYNTHROID) 125 MCG tablet Take 1 tablet by mouth daily. Take 1 by mouth daily     Magnesium Oxide (MAG-OXIDE PO) Take 400 mg by mouth 2 (two) times daily. Take 1 tablet twice a day     MOUNJARO 7.5 MG/0.5ML Pen Inject into the skin.     ondansetron (ZOFRAN) 4 MG tablet Take 1 tablet (4 mg total) by mouth every 8 (eight) hours as needed for nausea. 30 tablet 1   oxyCODONE-acetaminophen (PERCOCET/ROXICET) 5-325 MG tablet Take 1 tablet by mouth.     pregabalin (LYRICA) 25 MG capsule Take 100 mg by mouth in the morning and at bedtime.     Rimegepant Sulfate (NURTEC) 75 MG TBDP 1 po qod prn 12 tablet 5   rosuvastatin (CRESTOR) 5 MG tablet Take 5 mg by mouth daily. Take 1 by mouth daily     traMADol (ULTRAM) 50 MG tablet Take 50 mg by mouth every 6 (six) hours as needed.     No facility-administered medications prior to visit.    ROS: Review of Systems  Constitutional:  Negative for activity change, appetite change, chills, fatigue and unexpected weight change.  HENT:  Negative for congestion, mouth sores and sinus pressure.   Eyes:  Negative for visual disturbance.  Respiratory:  Negative for cough and chest tightness.   Gastrointestinal:  Negative for abdominal pain and nausea.  Genitourinary:  Negative for difficulty urinating, frequency and vaginal pain.  Musculoskeletal:  Positive for back pain. Negative for gait problem.  Skin:  Negative for pallor and rash.  Neurological:  Negative for dizziness, tremors, weakness, numbness and headaches.  Psychiatric/Behavioral:  Negative for confusion, sleep disturbance and suicidal ideas.     Objective:  BP 110/62 (BP Location: Left Arm, Patient Position: Sitting, Cuff Size: Normal)   Temp 97.9 F (36.6 C) (Oral)   Ht 5' 9"$  (1.753 m)   Wt 156 lb (70.8 kg)   LMP 12/22/2011   BMI 23.04 kg/m   BP Readings from Last 3 Encounters:   04/28/22 110/62  01/26/22 110/76  12/29/21 92/68    Wt Readings  from Last 3 Encounters:  04/28/22 156 lb (70.8 kg)  01/26/22 168 lb 9.6 oz (76.5 kg)  12/29/21 176 lb (79.8 kg)    Physical Exam Constitutional:      General: She is not in acute distress.    Appearance: Normal appearance. She is well-developed.  HENT:     Head: Normocephalic.     Right Ear: External ear normal.     Left Ear: External ear normal.     Nose: Nose normal.  Eyes:     General:        Right eye: No discharge.        Left eye: No discharge.     Conjunctiva/sclera: Conjunctivae normal.     Pupils: Pupils are equal, round, and reactive to light.  Neck:     Thyroid: No thyromegaly.     Vascular: No JVD.     Trachea: No tracheal deviation.  Cardiovascular:     Rate and Rhythm: Normal rate and regular rhythm.     Heart sounds: Normal heart sounds.  Pulmonary:     Effort: No respiratory distress.     Breath sounds: No stridor. No wheezing.  Abdominal:     General: Bowel sounds are normal. There is no distension.     Palpations: Abdomen is soft. There is no mass.     Tenderness: There is no abdominal tenderness. There is no guarding or rebound.  Musculoskeletal:        General: Tenderness present.     Cervical back: Normal range of motion and neck supple. No rigidity.  Lymphadenopathy:     Cervical: No cervical adenopathy.  Skin:    Findings: No erythema or rash.  Neurological:     Mental Status: She is oriented to person, place, and time.     Cranial Nerves: No cranial nerve deficit.     Motor: No abnormal muscle tone.     Coordination: Coordination normal.     Deep Tendon Reflexes: Reflexes normal.  Psychiatric:        Behavior: Behavior normal.        Thought Content: Thought content normal.        Judgment: Judgment normal.   Not depressed Abd is sensitive Pain w/ROM - abdomen, LS spine    A total time of 45 minutes was spent preparing to see the patient, reviewing tests, x-rays,  operative reports and other medical records.  Also, obtaining history and performing comprehensive physical exam.  Additionally, counseling the patient regarding the above listed issues ALH, hypothyroidism, PE.   Finally, documenting clinical information in the health records, coordination of care, educating the patient. It is a complex case.  Lab Results  Component Value Date   WBC 9.9 01/26/2022   HGB 10.5 (L) 01/26/2022   HCT 32.2 (L) 01/26/2022   PLT 393.0 01/26/2022   GLUCOSE 107 (H) 01/26/2022   CHOL 252 (H) 06/22/2016   TRIG 340.0 (H) 06/22/2016   HDL 38.00 (L) 06/22/2016   LDLDIRECT 135.0 06/22/2016   LDLCALC 133 (H) 01/11/2012   ALT 10 01/26/2022   AST 10 01/26/2022   NA 137 01/26/2022   K 4.2 01/26/2022   CL 99 01/26/2022   CREATININE 0.84 01/26/2022   BUN 17 01/26/2022   CO2 27 01/26/2022   TSH 5.81 (H) 01/26/2022   INR 0.95 11/25/2009   HGBA1C 6.7 (H) 01/26/2022    VAS [place] LOWER EXTREMITY VENOUS (DVT)  Result Date: 01/05/2022  Lower Venous DVT Study Patient Name:  Carle  P Weathers  Date of Exam:   01/05/2022 Medical Rec #: [id number]        Accession #:    [id number] Date of Birth: [birth year]        Patient Gender: F Patient Age:   59 years Exam Location:  [facility] Procedure:      VAS [place] LOWER EXTREMITY VENOUS (DVT) Referring Phys: Tyrone Apple Fraida Veldman --------------------------------------------------------------------------------  Indications: Acute PE.  Risk Factors: Surgery 11/26/2021 Diep Flap. Breast reconstruction Anticoagulation: Eliquis. Comparison Study: No prior exam Performing Technologist: Alvia Grove RVT  Examination Guidelines: A complete evaluation includes B-mode imaging, spectral Doppler, color Doppler, and power Doppler as needed of all accessible portions of each vessel. Bilateral testing is considered an integral part of a complete examination. Limited examinations for reoccurring indications may be performed as noted. The reflux portion of  the exam is performed with the patient in reverse Trendelenburg.  +---------+---------------+---------+-----------+----------+--------------+ RIGHT    CompressibilityPhasicitySpontaneityPropertiesThrombus Aging +---------+---------------+---------+-----------+----------+--------------+ CFV      Full           Yes      Yes                                 +---------+---------------+---------+-----------+----------+--------------+ SFJ      Full           Yes      Yes                                 +---------+---------------+---------+-----------+----------+--------------+ FV Prox  Full           Yes      Yes                                 +---------+---------------+---------+-----------+----------+--------------+ FV Mid   Full           Yes      Yes                                 +---------+---------------+---------+-----------+----------+--------------+ FV DistalFull           Yes      Yes                                 +---------+---------------+---------+-----------+----------+--------------+ PFV      Full           Yes      Yes                                 +---------+---------------+---------+-----------+----------+--------------+ POP      Full           Yes      Yes                                 +---------+---------------+---------+-----------+----------+--------------+ PTV      Full           Yes      Yes                                 +---------+---------------+---------+-----------+----------+--------------+  PERO     Full           Yes      Yes                                 +---------+---------------+---------+-----------+----------+--------------+ Gastroc  Full           Yes      Yes                                 +---------+---------------+---------+-----------+----------+--------------+ GSV      Full           Yes      Yes                                  +---------+---------------+---------+-----------+----------+--------------+ SSV      Full           Yes      Yes                                 +---------+---------------+---------+-----------+----------+--------------+   +---------+---------------+---------+-----------+----------+--------------+ LEFT     CompressibilityPhasicitySpontaneityPropertiesThrombus Aging +---------+---------------+---------+-----------+----------+--------------+ CFV      Full           Yes      Yes                                 +---------+---------------+---------+-----------+----------+--------------+ SFJ      Full           Yes      Yes                                 +---------+---------------+---------+-----------+----------+--------------+ FV Prox  Full           Yes      Yes                                 +---------+---------------+---------+-----------+----------+--------------+ FV Mid   Full           Yes      Yes                                 +---------+---------------+---------+-----------+----------+--------------+ FV DistalFull           Yes      Yes                                 +---------+---------------+---------+-----------+----------+--------------+ PFV      Full           Yes      Yes                                 +---------+---------------+---------+-----------+----------+--------------+ POP      Full           Yes      Yes                                 +---------+---------------+---------+-----------+----------+--------------+  PTV      Full           Yes      Yes                                 +---------+---------------+---------+-----------+----------+--------------+ PERO     Full           Yes      Yes                                 +---------+---------------+---------+-----------+----------+--------------+ Gastroc  Full           Yes      Yes                                  +---------+---------------+---------+-----------+----------+--------------+ GSV      Full           Yes      Yes                                 +---------+---------------+---------+-----------+----------+--------------+ SSV      Full           Yes      Yes                                 +---------+---------------+---------+-----------+----------+--------------+    Findings reported to [place] at 9:22.  Summary: RIGHT: - There is no evidence of deep vein thrombosis in the lower extremity. - There is no evidence of superficial venous thrombosis.  LEFT: - There is no evidence of deep vein thrombosis in the lower extremity. - There is no evidence of superficial venous thrombosis.  *See table(s) above for measurements and observations. Electronically signed by Monica Martinez MD on 01/05/2022 at 10:35:42 AM.    Final     Assessment & Plan:   Problem List Items Addressed This Visit       Cardiovascular and Mediastinum   Pulmonary embolism ([place]) - Primary    right lower lobe On Eliquis        Endocrine   Hypothyroidism    Chronic    On Synthroid 125 mcg daily      Diabetes mellitus type 2 in obese ([place])    . On Mounjaro  7.5 mg/wk only        Genitourinary   CRI (chronic renal insufficiency), stage 3 (moderate) (HCC)    Renal ultrasound - OK Hydrate well Monitor GFR.         Other   Major depressive disorder, recurrent episode, moderate (HCC)    Doing better      Breast tumor    S/p plastic surgery w/complications. Grew  out Pseudomonas - took Cipro. Wound closed in 04/2022 Hers (+)  S/p plastic surgery w/complications. Grew  out Pseudomonas - took Cipro. Wound closed in 04/2022 No further cancer Rx needed: Atypical lobular hyperplasia (ALH) and fibrocystic change.  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 If she receives BCT (lumpectomy) then will likely need whole breast radiation.  I printed her the pathology report and we went through it together today and discussed the implications  Prior to the visit today I discussed her case with Dr. Clovis Riley (surgeon) in our multidisciplinary breast cancer tumor board and our recommendation is to proceed with surgery  She is leaning towards a bilateral mastectomy and would like information on DIEP flap reconstruction  We discussed that if the invasive component is >5 mm on the final specimen then we will consider adjuvant HER2 directed therapy (likely paclitaxel with herceptin)  Discussed that typically 5 years of endocrine therapy is recommended however if the final pathology is just DCIS and she does have the bilateral mastectomy then we may consider NOT doing endocrine therapy as there is not a survival benefit in just DCIS. However if there does end up having a larger invasive area on the final pathology then we will certainly consider it  I will see her back a few weeks after her surgery to decide on next steps  Dr Clovis Riley: Autumn Howard is a 62 y.o. female received right breast biopsy- results showed microinvasive carcinoma in the background of high-grade DCIS. She underwent bilateral mastectomy on 09/10/2021. Pathology showed left breast atypical lobular hyperplasia, no invasive carcinoma. Right mastectomy benign. 0/3 LN.   She is having a bit more pain yesterday at bilateral drain sites. Overall doing well. Output has decreased bilaterally, less than 30 cc over 2 consecutive days.   Drain removed during clinic visit with no complications.   PLAN:  -- Schedule to meet Dr Dollene Cleveland, [facility] (patient can cancel appt) -- RTC, Dr Clovis Riley in 1 year following no imaging  -- Referral to Plastic Surgery, Dr. Morley Kos for reconstruction   -- Call with any new questions or concerns    Diagnosis path 09/2022     A.  BREAST, LEFT, MASTECTOMY:               Atypical lobular hyperplasia (ALH) and fibrocystic change.              Fibroadenoma.              Microcalcifications present.              Benign nipple and skin.              No in-situ or invasive carcinoma identified.    B.  SENTINEL LYMPH NODE #1, RIGHT, EXCISION:                One (1) lymph node is negative for carcinoma (0/1).    C.  SENTINEL LYMPH NODE #2, RIGHT, EXCISION:              One (1) lymph node is negative for carcinoma (0/1).    D.  SENTINEL LYMPH NODE #3, RIGHT, EXCISION:              One (1) lymph node is negative for carcinoma (0/1).    E.  BREAST, RIGHT, MASTECTOMY:               No in-situ or invasive carcinoma identified.               Biopsy sites change present.              Fibroadenomatoid change.              Sclerosing adenosis, usual  type ductal hyperplasia, and fibrocystic change.              Microcalcifications present in non-neoplastic epithelium.              Benign nipple and skin.           B12 deficiency    On B12         No orders of the defined types were placed in this encounter.     Follow-up: Return in about 4 months (around 08/27/2022).  Walker Kehr, MD

## 2022-04-28 NOTE — Assessment & Plan Note (Signed)
On B12 

## 2022-04-28 NOTE — Assessment & Plan Note (Signed)
Renal ultrasound - OK Hydrate well Monitor GFR.

## 2022-04-28 NOTE — Assessment & Plan Note (Signed)
Chronic    On Synthroid 125 mcg daily

## 2022-05-13 DIAGNOSIS — R29898 Other symptoms and signs involving the musculoskeletal system: Secondary | ICD-10-CM | POA: Insufficient documentation

## 2022-05-13 DIAGNOSIS — M24551 Contracture, right hip: Secondary | ICD-10-CM | POA: Insufficient documentation

## 2022-05-13 DIAGNOSIS — M47816 Spondylosis without myelopathy or radiculopathy, lumbar region: Secondary | ICD-10-CM | POA: Insufficient documentation

## 2022-06-03 DIAGNOSIS — G8928 Other chronic postprocedural pain: Secondary | ICD-10-CM | POA: Insufficient documentation

## 2022-06-03 DIAGNOSIS — I972 Postmastectomy lymphedema syndrome: Secondary | ICD-10-CM | POA: Insufficient documentation

## 2022-07-27 ENCOUNTER — Encounter: Payer: Self-pay | Admitting: Internal Medicine

## 2022-07-27 ENCOUNTER — Ambulatory Visit: Payer: PRIVATE HEALTH INSURANCE | Admitting: Internal Medicine

## 2022-07-27 VITALS — BP 122/80 | HR 76 | Temp 97.8°F | Ht 69.0 in | Wt 172.0 lb

## 2022-07-27 DIAGNOSIS — M7989 Other specified soft tissue disorders: Secondary | ICD-10-CM | POA: Diagnosis not present

## 2022-07-27 LAB — COMPREHENSIVE METABOLIC PANEL
ALT: 7 U/L (ref 0–35)
AST: 15 U/L (ref 0–37)
Albumin: 4 g/dL (ref 3.5–5.2)
Alkaline Phosphatase: 71 U/L (ref 39–117)
BUN: 23 mg/dL (ref 6–23)
CO2: 29 mEq/L (ref 19–32)
Calcium: 9.7 mg/dL (ref 8.4–10.5)
Chloride: 100 mEq/L (ref 96–112)
Creatinine, Ser: 1.12 mg/dL (ref 0.40–1.20)
GFR: 52.62 mL/min — ABNORMAL LOW (ref >60.00)
Glucose, Bld: 86 mg/dL (ref 70–99)
Potassium: 4.2 mEq/L (ref 3.5–5.1)
Sodium: 136 mEq/L (ref 135–145)
Total Bilirubin: 0.6 mg/dL (ref 0.2–1.2)
Total Protein: 7 g/dL (ref 6.0–8.3)

## 2022-07-27 LAB — BRAIN NATRIURETIC PEPTIDE: Pro B Natriuretic peptide (BNP): 86 pg/mL (ref 0.0–100.0)

## 2022-07-27 MED ORDER — FUROSEMIDE 20 MG PO TABS: 20.0000 mg | ORAL_TABLET | Freq: Every day | ORAL | 0 refills | Status: DC

## 2022-07-27 NOTE — Assessment & Plan Note (Signed)
Checking CMP, BNP, [place] to rule out DVT. Is not on eliquis since early March and recent 5 hour car ride at onset of symptoms. No clinical signs of PE. Rx lasix 20 mg daily for 5 days to help. Adjust treatment as needed.

## 2022-07-27 NOTE — Patient Instructions (Signed)
We will get the ultrasound of the legs done. We will do the labs today.   We have sent in lasix to take 1 pill daily for 4-5 days to help the fluid.

## 2022-07-27 NOTE — Progress Notes (Signed)
   Subjective:   Patient ID: Autumn Howard, female    DOB: 1960/05/07, 62 y.o.   MRN: [id number]  HPI The patient is a 62 YO female coming in for leg swelling and 12 pound gain. 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 Denies SOB or chest pain or cough. Has used more fluids and elevation this week and it has not helped. Started about 10 days ago. At onset took long car ride 5 hours to [facility].   Review of Systems  Constitutional: Negative.   HENT: Negative.    Eyes: Negative.   Respiratory:  Negative for cough, chest tightness and shortness of breath.   Cardiovascular:  Positive for leg swelling. Negative for chest pain and palpitations.  Gastrointestinal:  Negative for abdominal distention, abdominal pain, constipation, diarrhea, nausea and vomiting.  Musculoskeletal: Negative.   Skin: Negative.   Neurological: Negative.   Psychiatric/Behavioral: Negative.      Objective:  Physical Exam Constitutional:      Appearance: She is well-developed.  HENT:     Head: Normocephalic and atraumatic.  Cardiovascular:     Rate and Rhythm: Normal rate and regular rhythm.  Pulmonary:     Effort: Pulmonary effort is normal. No respiratory distress.     Breath sounds: Normal breath sounds. No wheezing or rales.  Abdominal:     General: Bowel sounds are normal. There is no distension.     Palpations: Abdomen is soft.     Tenderness: There is no abdominal tenderness. There is no rebound.  Musculoskeletal:     Cervical back: Normal range of motion.     Right lower leg: Edema present.     Left lower leg: Edema present.  Skin:    General: Skin is warm and dry.  Neurological:     Mental Status: She is alert and oriented to person, place, and time.     Coordination: Coordination normal.     Vitals:   07/27/22 1450  BP: 122/80  Pulse: 76  Temp: 97.8 F (36.6 C)  TempSrc: Oral  SpO2: 97%  Weight: 172 lb (78 kg)  Height: 5\' 9"  (1.753 m)    Assessment & Plan:   Visit time 20 minutes in face to face communication with patient and coordination of care, additional 10 minutes spent in record review, coordination or care, ordering tests, communicating/referring to other healthcare professionals, documenting in medical records all on the same day of the visit for total time 30 minutes spent on the visit.

## 2022-07-28 ENCOUNTER — Ambulatory Visit (HOSPITAL_COMMUNITY)
Admission: RE | Admit: 2022-07-28 | Discharge: 2022-07-28 | Disposition: A | Discharge status: Home / Self Care | Payer: PRIVATE HEALTH INSURANCE | Source: Ambulatory Visit | Attending: Internal Medicine | Admitting: Internal Medicine

## 2022-07-28 DIAGNOSIS — M7989 Other specified soft tissue disorders: Secondary | ICD-10-CM | POA: Diagnosis not present

## 2022-08-30 ENCOUNTER — Encounter: Payer: Self-pay | Admitting: Internal Medicine

## 2022-08-30 ENCOUNTER — Ambulatory Visit: Payer: PRIVATE HEALTH INSURANCE | Admitting: Internal Medicine

## 2022-08-30 VITALS — BP 118/62 | HR 80 | Temp 97.5°F | Ht 69.0 in | Wt 174.0 lb

## 2022-08-30 DIAGNOSIS — R739 Hyperglycemia, unspecified: Secondary | ICD-10-CM | POA: Diagnosis not present

## 2022-08-30 DIAGNOSIS — M7989 Other specified soft tissue disorders: Secondary | ICD-10-CM

## 2022-08-30 DIAGNOSIS — G8928 Other chronic postprocedural pain: Secondary | ICD-10-CM

## 2022-08-30 LAB — COMPREHENSIVE METABOLIC PANEL
ALT: 7 U/L (ref 0–35)
AST: 13 U/L (ref 0–37)
Albumin: 3.9 g/dL (ref 3.5–5.2)
Alkaline Phosphatase: 70 U/L (ref 39–117)
BUN: 31 mg/dL — ABNORMAL HIGH (ref 6–23)
CO2: 28 mEq/L (ref 19–32)
Calcium: 9.4 mg/dL (ref 8.4–10.5)
Chloride: 108 mEq/L (ref 96–112)
Creatinine, Ser: 1.28 mg/dL — ABNORMAL HIGH (ref 0.40–1.20)
GFR: 44.8 mL/min — ABNORMAL LOW (ref >60.00)
Glucose, Bld: 106 mg/dL — ABNORMAL HIGH (ref 70–99)
Potassium: 3.8 mEq/L (ref 3.5–5.1)
Sodium: 141 mEq/L (ref 135–145)
Total Bilirubin: 0.7 mg/dL (ref 0.2–1.2)
Total Protein: 6.9 g/dL (ref 6.0–8.3)

## 2022-08-30 LAB — T4, FREE: Free T4: 1.02 ng/dL (ref 0.60–1.60)

## 2022-08-30 LAB — HEMOGLOBIN A1C: Hgb A1c MFr Bld: 5.7 % (ref 4.6–6.5)

## 2022-08-30 LAB — TSH: TSH: 0.42 u[IU]/mL (ref 0.35–5.50)

## 2022-08-30 MED ORDER — PREGABALIN 100 MG PO CAPS: 100.0000 mg | ORAL_CAPSULE | Freq: Two times a day (BID) | ORAL | 3 refills | Status: DC

## 2022-08-30 NOTE — Assessment & Plan Note (Signed)
L breast pain/CP C/o feet swelling, wt gain 20 lbs since 07/2022 - pt took Cipro po then. On Lyrica 100 mg bid per Pain clinic - will taper off Cont w/Lasix prn F/u w/Pain clinic

## 2022-08-30 NOTE — Progress Notes (Signed)
Subjective:  Patient ID: Autumn Howard, female    DOB: Oct 18, 1960  Age: 62 y.o. 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 F/u on DM, hypothyroidism C/o feet swelling, wt gain 20 lbs since 07/2022 - pt took Cipro po then, it was hot. On Lyrica 100 mg bid per Pain clinic   Outpatient Medications Prior to Visit  Medication Sig Dispense Refill   Baclofen 5 MG TABS Take 1 tablet by mouth 3 (three) times daily.     Cholecalciferol (VITAMIN D3) 2000 units capsule Take 1 capsule (2,000 Units total) by mouth daily. 100 capsule 3   Cyanocobalamin (VITAMIN B-12) 1000 MCG SUBL Place 1 tablet (1,000 mcg total) under the tongue daily. 100 tablet 3   furosemide (LASIX) 20 MG tablet Take 1 tablet (20 mg total) by mouth daily. 10 tablet 0   levothyroxine (SYNTHROID) 125 MCG tablet Take 1 tablet by mouth daily. Take 1 by mouth daily     Magnesium Oxide (MAG-OXIDE PO) Take 400 mg by mouth 2 (two) times daily. Take 1 tablet twice a day     MOUNJARO 7.5 MG/0.5ML Pen Inject into the skin.     ondansetron (ZOFRAN) 4 MG tablet Take 1 tablet (4 mg total) by mouth every 8 (eight) hours as needed for nausea. 30 tablet 1   Rimegepant Sulfate (NURTEC) 75 MG TBDP 1 po qod prn 12 tablet 5   rosuvastatin (CRESTOR) 5 MG tablet Take 5 mg by mouth daily. Take 1 by mouth daily     traMADol (ULTRAM) 50 MG tablet Take 50 mg by mouth every 6 (six) hours as needed.     pregabalin (LYRICA) 25 MG capsule Take 100 mg by mouth in the morning and at bedtime.     No facility-administered medications prior to visit.    ROS: Review of Systems  Constitutional:  Positive for unexpected weight change. Negative for activity change, appetite change, chills and fatigue.  HENT:  Negative for congestion, mouth sores and sinus pressure.   Eyes:  Negative for visual  disturbance.  Respiratory:  Negative for cough and chest tightness.   Cardiovascular:  Positive for leg swelling.  Gastrointestinal:  Negative for abdominal pain and nausea.  Genitourinary:  Negative for difficulty urinating, frequency and vaginal pain.  Musculoskeletal:  Negative for back pain and gait problem.  Skin:  Negative for pallor and rash.  Neurological:  Negative for dizziness, tremors, weakness, numbness and headaches.  Psychiatric/Behavioral:  Negative for confusion, sleep disturbance and suicidal ideas. The patient is nervous/anxious.     Objective:  BP 118/62 (BP Location: Right Arm, Patient Position: Sitting, Cuff Size: Normal)   Pulse 80   Temp (!) 97.5 F (36.4 C) (Oral)   Ht 5\' 9"  (1.753 m)   Wt 174 lb (78.9 kg)   LMP 12/22/2011   SpO2 98%   BMI 25.70 kg/m   BP Readings from Last 3 Encounters:  08/30/22 118/62  07/27/22 122/80  04/28/22 110/62    Wt Readings from Last 3 Encounters:  08/30/22 174 lb (78.9 kg)  07/27/22 172 lb (78 kg)  04/28/22 156 lb (70.8 kg)    Physical Exam Constitutional:      General: She is not in acute distress.    Appearance: Normal appearance. She is well-developed. She is not ill-appearing.  HENT:  Head: Normocephalic.     Right Ear: External ear normal.     Left Ear: External ear normal.     Nose: Nose normal.  Eyes:     General:        Right eye: No discharge.        Left eye: No discharge.     Conjunctiva/sclera: Conjunctivae normal.     Pupils: Pupils are equal, round, and reactive to light.  Neck:     Thyroid: No thyromegaly.     Vascular: No JVD.     Trachea: No tracheal deviation.  Cardiovascular:     Rate and Rhythm: Normal rate and regular rhythm.     Heart sounds: Normal heart sounds.  Pulmonary:     Effort: No respiratory distress.     Breath sounds: No stridor. No wheezing.  Abdominal:     General: Bowel sounds are normal. There is no distension.     Palpations: Abdomen is soft. There is no mass.      Tenderness: There is no abdominal tenderness. There is no guarding or rebound.  Musculoskeletal:        General: No tenderness.     Cervical back: Normal range of motion and neck supple. No rigidity.     Right lower leg: Edema present.     Left lower leg: Edema present.  Lymphadenopathy:     Cervical: No cervical adenopathy.  Skin:    Findings: No erythema or rash.  Neurological:     Cranial Nerves: No cranial nerve deficit.     Motor: No abnormal muscle tone.     Coordination: Coordination normal.     Deep Tendon Reflexes: Reflexes normal.  Psychiatric:        Behavior: Behavior normal.        Thought Content: Thought content normal.        Judgment: Judgment normal.   B feet are with trace edema  Lab Results  Component Value Date   WBC 9.9 01/26/2022   HGB 10.5 (L) 01/26/2022   HCT 32.2 (L) 01/26/2022   PLT 393.0 01/26/2022   GLUCOSE 86 07/27/2022   CHOL 252 (H) 06/22/2016   TRIG 340.0 (H) 06/22/2016   HDL 38.00 (L) 06/22/2016   LDLDIRECT 135.0 06/22/2016   LDLCALC 133 (H) 01/11/2012   ALT 7 07/27/2022   AST 15 07/27/2022   NA 136 07/27/2022   K 4.2 07/27/2022   CL 100 07/27/2022   CREATININE 1.12 07/27/2022   BUN 23 07/27/2022   CO2 29 07/27/2022   TSH 5.81 (H) 01/26/2022   INR 0.95 11/25/2009   HGBA1C 6.7 (H) 01/26/2022    VAS [place] LOWER EXTREMITY VENOUS (DVT)  Result Date: 07/28/2022  Lower Venous DVT Study Patient Name:  Autumn Howard  Date of Exam:   07/28/2022 Medical Rec #: [id number]        Accession #:    [id number] Date of Birth: [birth year]        Patient Gender: F Patient Age:   65 years Exam Location:  [facility] Procedure:      VAS [place] LOWER EXTREMITY VENOUS (DVT) Referring Phys: Lanora Manis CRAWFORD --------------------------------------------------------------------------------  Indications: Edema, and denies SOB or chest pain. Other Indications: 6 months' treatment for PE with Eliquis, which was                    discontinued at the beginning of May  2024. She took a 5-hour  car ride, and has now developed bilateral lower extremity                    edema plus a 12 pound weight gain. Risk Factors: Recent extended travel 01/05/2022. Comparison Study: 01/05/2022 was negative for DVT bilaterally. Performing Technologist: [employer] BS, RVT, RDCS  Examination Guidelines: A complete evaluation includes B-mode imaging, spectral Doppler, color Doppler, and power Doppler as needed of all accessible portions of each vessel. Bilateral testing is considered an integral part of a complete examination. Limited examinations for reoccurring indications may be performed as noted. The reflux portion of the exam is performed with the patient in reverse Trendelenburg.  +---------+---------------+---------+-----------+----------+--------------+ RIGHT    CompressibilityPhasicitySpontaneityPropertiesThrombus Aging +---------+---------------+---------+-----------+----------+--------------+ CFV      Full           Yes      Yes                                 +---------+---------------+---------+-----------+----------+--------------+ SFJ      Full           Yes      Yes                                 +---------+---------------+---------+-----------+----------+--------------+ FV Prox  Full           Yes      Yes                                 +---------+---------------+---------+-----------+----------+--------------+ FV Mid   Full           Yes      Yes                                 +---------+---------------+---------+-----------+----------+--------------+ FV DistalFull           Yes      Yes                                 +---------+---------------+---------+-----------+----------+--------------+ PFV      Full           Yes      Yes                                 +---------+---------------+---------+-----------+----------+--------------+ POP      Full           Yes      Yes                                  +---------+---------------+---------+-----------+----------+--------------+ PTV      Full           Yes      Yes                                 +---------+---------------+---------+-----------+----------+--------------+ PERO     Full           Yes      Yes                                 +---------+---------------+---------+-----------+----------+--------------+  Gastroc  Full                                                        +---------+---------------+---------+-----------+----------+--------------+ GSV      Full                                                        +---------+---------------+---------+-----------+----------+--------------+   +---------+---------------+---------+-----------+----------+--------------+ LEFT     CompressibilityPhasicitySpontaneityPropertiesThrombus Aging +---------+---------------+---------+-----------+----------+--------------+ CFV      Full           Yes      Yes                                 +---------+---------------+---------+-----------+----------+--------------+ SFJ      Full           Yes      Yes                                 +---------+---------------+---------+-----------+----------+--------------+ FV Prox  Full           Yes      Yes                                 +---------+---------------+---------+-----------+----------+--------------+ FV Mid   Full           Yes      Yes                                 +---------+---------------+---------+-----------+----------+--------------+ FV DistalFull           Yes      Yes                                 +---------+---------------+---------+-----------+----------+--------------+ PFV      Full           Yes      Yes                                 +---------+---------------+---------+-----------+----------+--------------+ POP      Full           Yes      Yes                                  +---------+---------------+---------+-----------+----------+--------------+ PTV      Full           Yes      Yes                                 +---------+---------------+---------+-----------+----------+--------------+ PERO     Full           Yes  Yes                                 +---------+---------------+---------+-----------+----------+--------------+ Gastroc  Full                                                        +---------+---------------+---------+-----------+----------+--------------+ GSV      Full                                                        +---------+---------------+---------+-----------+----------+--------------+ Deep venous reflux noted in the mid and distal femoral vein, and the popliteal vein.   Findings reported to Dr. Okey Dupre through Epic routing at 10:05AM.  Summary: BILATERAL: - No evidence of deep vein thrombosis seen in the lower extremities, bilaterally. - No evidence of superficial venous thrombosis in the lower extremities, bilaterally. -  LEFT: - Deep venous reflux in the femoral and popliteal veins.  *See table(s) above for measurements and observations. Electronically signed by Lemar Livings MD on 07/28/2022 at 5:06:20 PM.    Final     Assessment & Plan:   Problem List Items Addressed This Visit     Hyperglycemia   Relevant Orders   Hemoglobin A1c   Leg swelling - Primary    New C/o feet swelling, wt gain 20 lbs since 07/2022 - pt took Cipro po then. On Lyrica 100 mg bid per Pain clinic - will taper off Cont w/Lasix prn F/u w/Pain clinic       Relevant Orders   Comprehensive metabolic panel   T4, free   TSH   Hemoglobin A1c   Post-mastectomy pain syndrome    L breast pain/CP C/o feet swelling, wt gain 20 lbs since 07/2022 - pt took Cipro po then. On Lyrica 100 mg bid per Pain clinic - will taper off Cont w/Lasix prn F/u w/Pain clinic       Relevant Medications   pregabalin (LYRICA) 100 MG capsule   Other  Relevant Orders   Comprehensive metabolic panel   T4, free   TSH   Hemoglobin A1c      Meds ordered this encounter  Medications   pregabalin (LYRICA) 100 MG capsule    Sig: Take 1 capsule (100 mg total) by mouth 2 (two) times daily.    Dispense:  60 capsule    Refill:  3      Follow-up: No follow-ups on file.  Sonda Primes, MD

## 2022-08-30 NOTE — Assessment & Plan Note (Signed)
New C/o feet swelling, wt gain 20 lbs since 07/2022 - pt took Cipro po then. On Lyrica 100 mg bid per Pain clinic - will taper off Cont w/Lasix prn F/u w/Pain clinic

## 2022-09-10 DIAGNOSIS — I890 Lymphedema, not elsewhere classified: Secondary | ICD-10-CM | POA: Insufficient documentation

## 2022-09-10 DIAGNOSIS — Z9013 Acquired absence of bilateral breasts and nipples: Secondary | ICD-10-CM | POA: Insufficient documentation

## 2022-10-21 ENCOUNTER — Encounter (INDEPENDENT_AMBULATORY_CARE_PROVIDER_SITE_OTHER): Payer: Self-pay

## 2022-11-30 ENCOUNTER — Encounter: Payer: Self-pay | Admitting: Internal Medicine

## 2022-11-30 ENCOUNTER — Ambulatory Visit: Payer: PRIVATE HEALTH INSURANCE | Admitting: Internal Medicine

## 2022-11-30 VITALS — BP 118/70 | HR 69 | Temp 98.1°F | Ht 69.0 in | Wt 173.0 lb

## 2022-11-30 DIAGNOSIS — N183 Chronic kidney disease, stage 3 unspecified: Secondary | ICD-10-CM

## 2022-11-30 DIAGNOSIS — E063 Autoimmune thyroiditis: Secondary | ICD-10-CM | POA: Diagnosis not present

## 2022-11-30 DIAGNOSIS — E559 Vitamin D deficiency, unspecified: Secondary | ICD-10-CM | POA: Diagnosis not present

## 2022-11-30 DIAGNOSIS — F4323 Adjustment disorder with mixed anxiety and depressed mood: Secondary | ICD-10-CM | POA: Diagnosis not present

## 2022-11-30 DIAGNOSIS — E669 Obesity, unspecified: Secondary | ICD-10-CM

## 2022-11-30 DIAGNOSIS — E1169 Type 2 diabetes mellitus with other specified complication: Secondary | ICD-10-CM

## 2022-11-30 DIAGNOSIS — E538 Deficiency of other specified B group vitamins: Secondary | ICD-10-CM

## 2022-11-30 DIAGNOSIS — Z7985 Long-term (current) use of injectable non-insulin antidiabetic drugs: Secondary | ICD-10-CM

## 2022-11-30 MED ORDER — TIRZEPATIDE 10 MG/0.5ML ~~LOC~~ SOAJ: 10.0000 mg | SUBCUTANEOUS | Status: AC

## 2022-11-30 NOTE — Assessment & Plan Note (Signed)
Nephrology d/c'd the pt  GFR was 54

## 2022-11-30 NOTE — Assessment & Plan Note (Signed)
Marland Kitchen  On Mounjaro  7.5 mg/wk only

## 2022-11-30 NOTE — Assessment & Plan Note (Signed)
On B12 

## 2022-11-30 NOTE — Progress Notes (Signed)
normal.  Eyes:     General:        Right eye: No discharge.        Left eye: No discharge.     Conjunctiva/sclera: Conjunctivae normal.     Pupils: Pupils are equal, round, and reactive to light.  Neck:     Thyroid: No thyromegaly.     Vascular: No JVD.     Trachea: No tracheal deviation.  Cardiovascular:     Rate and Rhythm: Normal rate and regular rhythm.     Heart sounds: Normal heart sounds.  Pulmonary:     Effort: No respiratory distress.     Breath sounds: No stridor. No wheezing.  Abdominal:     General: Bowel sounds are normal. There is no distension.     Palpations: Abdomen is soft. There is no mass.     Tenderness: There is no abdominal tenderness. There is no guarding or rebound.  Musculoskeletal:        General: No tenderness.     Cervical back: Normal range of motion and neck supple.  No rigidity.  Lymphadenopathy:     Cervical: No cervical adenopathy.  Skin:    Findings: No erythema or rash.  Neurological:     Mental Status: She is oriented to person, place, and time.     Cranial Nerves: No cranial nerve deficit.     Motor: No abnormal muscle tone.     Coordination: Coordination normal.     Deep Tendon Reflexes: Reflexes normal.  Psychiatric:        Behavior: Behavior normal.        Thought Content: Thought content normal.        Judgment: Judgment normal.     Lab Results  Component Value Date   WBC 9.9 01/26/2022   HGB 10.5 (L) 01/26/2022   HCT 32.2 (L) 01/26/2022   PLT 393.0 01/26/2022   GLUCOSE 106 (H) 08/30/2022   CHOL 252 (H) 06/22/2016   TRIG 340.0 (H) 06/22/2016   HDL 38.00 (L) 06/22/2016   LDLDIRECT 135.0 06/22/2016   LDLCALC 133 (H) 01/11/2012   ALT 7 08/30/2022   AST 13 08/30/2022   NA 141 08/30/2022   K 3.8 08/30/2022   CL 108 08/30/2022   CREATININE 1.28 (H) 08/30/2022   BUN 31 (H) 08/30/2022   CO2 28 08/30/2022   TSH 0.42 08/30/2022   INR 0.95 11/25/2009   HGBA1C 5.7 08/30/2022    VAS [place] LOWER EXTREMITY VENOUS (DVT)  Result Date: 07/28/2022  Lower Venous DVT Study Patient Name:  Autumn Howard  Date of Exam:   07/28/2022 Medical Rec #: [id number]        Accession #:    [id number] Date of Birth: [birth year]        Patient Gender: F Patient Age:   62 years Exam Location:  [facility] Procedure:      VAS [place] LOWER EXTREMITY VENOUS (DVT) Referring Phys: Lanora Manis CRAWFORD --------------------------------------------------------------------------------  Indications: Edema, and denies SOB or chest pain. Other Indications: 6 months' treatment for PE with Eliquis, which was                    discontinued at the beginning of May 2024. She took a 5-hour                    car ride, and has now developed bilateral lower extremity  normal.  Eyes:     General:        Right eye: No discharge.        Left eye: No discharge.     Conjunctiva/sclera: Conjunctivae normal.     Pupils: Pupils are equal, round, and reactive to light.  Neck:     Thyroid: No thyromegaly.     Vascular: No JVD.     Trachea: No tracheal deviation.  Cardiovascular:     Rate and Rhythm: Normal rate and regular rhythm.     Heart sounds: Normal heart sounds.  Pulmonary:     Effort: No respiratory distress.     Breath sounds: No stridor. No wheezing.  Abdominal:     General: Bowel sounds are normal. There is no distension.     Palpations: Abdomen is soft. There is no mass.     Tenderness: There is no abdominal tenderness. There is no guarding or rebound.  Musculoskeletal:        General: No tenderness.     Cervical back: Normal range of motion and neck supple.  No rigidity.  Lymphadenopathy:     Cervical: No cervical adenopathy.  Skin:    Findings: No erythema or rash.  Neurological:     Mental Status: She is oriented to person, place, and time.     Cranial Nerves: No cranial nerve deficit.     Motor: No abnormal muscle tone.     Coordination: Coordination normal.     Deep Tendon Reflexes: Reflexes normal.  Psychiatric:        Behavior: Behavior normal.        Thought Content: Thought content normal.        Judgment: Judgment normal.     Lab Results  Component Value Date   WBC 9.9 01/26/2022   HGB 10.5 (L) 01/26/2022   HCT 32.2 (L) 01/26/2022   PLT 393.0 01/26/2022   GLUCOSE 106 (H) 08/30/2022   CHOL 252 (H) 06/22/2016   TRIG 340.0 (H) 06/22/2016   HDL 38.00 (L) 06/22/2016   LDLDIRECT 135.0 06/22/2016   LDLCALC 133 (H) 01/11/2012   ALT 7 08/30/2022   AST 13 08/30/2022   NA 141 08/30/2022   K 3.8 08/30/2022   CL 108 08/30/2022   CREATININE 1.28 (H) 08/30/2022   BUN 31 (H) 08/30/2022   CO2 28 08/30/2022   TSH 0.42 08/30/2022   INR 0.95 11/25/2009   HGBA1C 5.7 08/30/2022    VAS [place] LOWER EXTREMITY VENOUS (DVT)  Result Date: 07/28/2022  Lower Venous DVT Study Patient Name:  Autumn Howard  Date of Exam:   07/28/2022 Medical Rec #: [id number]        Accession #:    [id number] Date of Birth: [birth year]        Patient Gender: F Patient Age:   62 years Exam Location:  [facility] Procedure:      VAS [place] LOWER EXTREMITY VENOUS (DVT) Referring Phys: Lanora Manis CRAWFORD --------------------------------------------------------------------------------  Indications: Edema, and denies SOB or chest pain. Other Indications: 6 months' treatment for PE with Eliquis, which was                    discontinued at the beginning of May 2024. She took a 5-hour                    car ride, and has now developed bilateral lower extremity  normal.  Eyes:     General:        Right eye: No discharge.        Left eye: No discharge.     Conjunctiva/sclera: Conjunctivae normal.     Pupils: Pupils are equal, round, and reactive to light.  Neck:     Thyroid: No thyromegaly.     Vascular: No JVD.     Trachea: No tracheal deviation.  Cardiovascular:     Rate and Rhythm: Normal rate and regular rhythm.     Heart sounds: Normal heart sounds.  Pulmonary:     Effort: No respiratory distress.     Breath sounds: No stridor. No wheezing.  Abdominal:     General: Bowel sounds are normal. There is no distension.     Palpations: Abdomen is soft. There is no mass.     Tenderness: There is no abdominal tenderness. There is no guarding or rebound.  Musculoskeletal:        General: No tenderness.     Cervical back: Normal range of motion and neck supple.  No rigidity.  Lymphadenopathy:     Cervical: No cervical adenopathy.  Skin:    Findings: No erythema or rash.  Neurological:     Mental Status: She is oriented to person, place, and time.     Cranial Nerves: No cranial nerve deficit.     Motor: No abnormal muscle tone.     Coordination: Coordination normal.     Deep Tendon Reflexes: Reflexes normal.  Psychiatric:        Behavior: Behavior normal.        Thought Content: Thought content normal.        Judgment: Judgment normal.     Lab Results  Component Value Date   WBC 9.9 01/26/2022   HGB 10.5 (L) 01/26/2022   HCT 32.2 (L) 01/26/2022   PLT 393.0 01/26/2022   GLUCOSE 106 (H) 08/30/2022   CHOL 252 (H) 06/22/2016   TRIG 340.0 (H) 06/22/2016   HDL 38.00 (L) 06/22/2016   LDLDIRECT 135.0 06/22/2016   LDLCALC 133 (H) 01/11/2012   ALT 7 08/30/2022   AST 13 08/30/2022   NA 141 08/30/2022   K 3.8 08/30/2022   CL 108 08/30/2022   CREATININE 1.28 (H) 08/30/2022   BUN 31 (H) 08/30/2022   CO2 28 08/30/2022   TSH 0.42 08/30/2022   INR 0.95 11/25/2009   HGBA1C 5.7 08/30/2022    VAS [place] LOWER EXTREMITY VENOUS (DVT)  Result Date: 07/28/2022  Lower Venous DVT Study Patient Name:  Autumn Howard  Date of Exam:   07/28/2022 Medical Rec #: [id number]        Accession #:    [id number] Date of Birth: [birth year]        Patient Gender: F Patient Age:   62 years Exam Location:  [facility] Procedure:      VAS [place] LOWER EXTREMITY VENOUS (DVT) Referring Phys: Lanora Manis CRAWFORD --------------------------------------------------------------------------------  Indications: Edema, and denies SOB or chest pain. Other Indications: 6 months' treatment for PE with Eliquis, which was                    discontinued at the beginning of May 2024. She took a 5-hour                    car ride, and has now developed bilateral lower extremity  normal.  Eyes:     General:        Right eye: No discharge.        Left eye: No discharge.     Conjunctiva/sclera: Conjunctivae normal.     Pupils: Pupils are equal, round, and reactive to light.  Neck:     Thyroid: No thyromegaly.     Vascular: No JVD.     Trachea: No tracheal deviation.  Cardiovascular:     Rate and Rhythm: Normal rate and regular rhythm.     Heart sounds: Normal heart sounds.  Pulmonary:     Effort: No respiratory distress.     Breath sounds: No stridor. No wheezing.  Abdominal:     General: Bowel sounds are normal. There is no distension.     Palpations: Abdomen is soft. There is no mass.     Tenderness: There is no abdominal tenderness. There is no guarding or rebound.  Musculoskeletal:        General: No tenderness.     Cervical back: Normal range of motion and neck supple.  No rigidity.  Lymphadenopathy:     Cervical: No cervical adenopathy.  Skin:    Findings: No erythema or rash.  Neurological:     Mental Status: She is oriented to person, place, and time.     Cranial Nerves: No cranial nerve deficit.     Motor: No abnormal muscle tone.     Coordination: Coordination normal.     Deep Tendon Reflexes: Reflexes normal.  Psychiatric:        Behavior: Behavior normal.        Thought Content: Thought content normal.        Judgment: Judgment normal.     Lab Results  Component Value Date   WBC 9.9 01/26/2022   HGB 10.5 (L) 01/26/2022   HCT 32.2 (L) 01/26/2022   PLT 393.0 01/26/2022   GLUCOSE 106 (H) 08/30/2022   CHOL 252 (H) 06/22/2016   TRIG 340.0 (H) 06/22/2016   HDL 38.00 (L) 06/22/2016   LDLDIRECT 135.0 06/22/2016   LDLCALC 133 (H) 01/11/2012   ALT 7 08/30/2022   AST 13 08/30/2022   NA 141 08/30/2022   K 3.8 08/30/2022   CL 108 08/30/2022   CREATININE 1.28 (H) 08/30/2022   BUN 31 (H) 08/30/2022   CO2 28 08/30/2022   TSH 0.42 08/30/2022   INR 0.95 11/25/2009   HGBA1C 5.7 08/30/2022    VAS [place] LOWER EXTREMITY VENOUS (DVT)  Result Date: 07/28/2022  Lower Venous DVT Study Patient Name:  Autumn Howard  Date of Exam:   07/28/2022 Medical Rec #: [id number]        Accession #:    [id number] Date of Birth: [birth year]        Patient Gender: F Patient Age:   62 years Exam Location:  [facility] Procedure:      VAS [place] LOWER EXTREMITY VENOUS (DVT) Referring Phys: Lanora Manis CRAWFORD --------------------------------------------------------------------------------  Indications: Edema, and denies SOB or chest pain. Other Indications: 6 months' treatment for PE with Eliquis, which was                    discontinued at the beginning of May 2024. She took a 5-hour                    car ride, and has now developed bilateral lower extremity  Subjective:  Patient ID: Autumn Howard, female    DOB: Dec 16, 1960  Age: 62 y.o. MRN: [id number]  CC: Follow-up (3 MNT F/U)   HPI Autumn Howard presents for CRI - GFR was 54 F/u DM, hypothyroidism  Outpatient Medications Prior to Visit  Medication Sig Dispense Refill   Cholecalciferol (VITAMIN D3) 2000 units capsule Take 1 capsule (2,000 Units total) by mouth daily. 100 capsule 3   Cyanocobalamin (VITAMIN B-12) 1000 MCG SUBL Place 1 tablet (1,000 mcg total) under the tongue daily. 100 tablet 3   levothyroxine (SYNTHROID) 125 MCG tablet Take 1 tablet by mouth daily. Take 1 by mouth daily     Magnesium Oxide (MAG-OXIDE PO) Take 400 mg by mouth 2 (two) times daily. Take 1 tablet twice a day     ondansetron (ZOFRAN) 4 MG tablet Take 1 tablet (4 mg total) by mouth every 8 (eight) hours as needed for nausea. 30 tablet 1   oxyCODONE (OXY IR/ROXICODONE) 5 MG immediate release tablet Take by mouth.     rosuvastatin (CRESTOR) 5 MG tablet Take 5 mg by mouth daily. Take 1 by mouth daily     Baclofen 5 MG TABS Take 1 tablet by mouth 3 (three) times daily.     furosemide (LASIX) 20 MG tablet Take 1 tablet (20 mg total) by mouth daily. 10 tablet 0   MOUNJARO 7.5 MG/0.5ML Pen Inject into the skin.     ondansetron (ZOFRAN-ODT) 4 MG disintegrating tablet Take 4 mg by mouth every 8 (eight) hours as needed.     pregabalin (LYRICA) 100 MG capsule Take 1 capsule (100 mg total) by mouth 2 (two) times daily. 60 capsule 3   Rimegepant Sulfate (NURTEC) 75 MG TBDP 1 po qod prn 12 tablet 5   traMADol (ULTRAM) 50 MG tablet Take 50 mg by mouth every 6 (six) hours as needed.     No facility-administered medications prior to visit.    ROS: Review of Systems  Constitutional:  Negative for activity change, appetite change, chills, fatigue and unexpected weight change.  HENT:  Negative for congestion, mouth sores and sinus pressure.   Eyes:  Negative for visual disturbance.  Respiratory:  Negative for cough and  chest tightness.   Gastrointestinal:  Negative for abdominal pain and nausea.  Genitourinary:  Negative for difficulty urinating, frequency and vaginal pain.  Musculoskeletal:  Negative for back pain and gait problem.  Skin:  Negative for pallor and rash.  Neurological:  Negative for dizziness, tremors, weakness, numbness and headaches.  Psychiatric/Behavioral:  Negative for confusion and sleep disturbance.     Objective:  BP 118/70 (BP Location: Right Arm, Patient Position: Sitting, Cuff Size: Normal)   Pulse 69   Temp 98.1 F (36.7 C) (Oral)   Ht 5\' 9"  (1.753 m)   Wt 173 lb (78.5 kg)   LMP 12/22/2011   SpO2 97%   BMI 25.55 kg/m   BP Readings from Last 3 Encounters:  11/30/22 118/70  08/30/22 118/62  07/27/22 122/80    Wt Readings from Last 3 Encounters:  11/30/22 173 lb (78.5 kg)  08/30/22 174 lb (78.9 kg)  07/27/22 172 lb (78 kg)    Physical Exam Constitutional:      General: She is not in acute distress.    Appearance: She is well-developed.  HENT:     Head: Normocephalic.     Right Ear: External ear normal.     Left Ear: External ear normal.     Nose: Nose

## 2022-11-30 NOTE — Assessment & Plan Note (Signed)
-  Continue with vitamin D 2000 units daily

## 2022-11-30 NOTE — Assessment & Plan Note (Signed)
She wants to go back on Synthroid

## 2022-11-30 NOTE — Assessment & Plan Note (Signed)
Chronic  Potential benefits of a long term benzodiazepines  use as well as potential risks  and complications were explained to the patient and were aknowledged.

## 2023-04-04 ENCOUNTER — Encounter: Payer: Self-pay | Admitting: Internal Medicine

## 2023-04-04 ENCOUNTER — Ambulatory Visit: Payer: PRIVATE HEALTH INSURANCE | Admitting: Internal Medicine

## 2023-04-04 VITALS — BP 110/80 | HR 77 | Temp 98.6°F | Ht 69.0 in | Wt 180.0 lb

## 2023-04-04 DIAGNOSIS — F331 Major depressive disorder, recurrent, moderate: Secondary | ICD-10-CM | POA: Diagnosis not present

## 2023-04-04 DIAGNOSIS — N183 Chronic kidney disease, stage 3 unspecified: Secondary | ICD-10-CM

## 2023-04-04 DIAGNOSIS — E063 Autoimmune thyroiditis: Secondary | ICD-10-CM

## 2023-04-04 DIAGNOSIS — G43109 Migraine with aura, not intractable, without status migrainosus: Secondary | ICD-10-CM

## 2023-04-04 DIAGNOSIS — E538 Deficiency of other specified B group vitamins: Secondary | ICD-10-CM

## 2023-04-04 DIAGNOSIS — Z7985 Long-term (current) use of injectable non-insulin antidiabetic drugs: Secondary | ICD-10-CM

## 2023-04-04 DIAGNOSIS — E669 Obesity, unspecified: Secondary | ICD-10-CM | POA: Diagnosis not present

## 2023-04-04 DIAGNOSIS — E1169 Type 2 diabetes mellitus with other specified complication: Secondary | ICD-10-CM | POA: Diagnosis not present

## 2023-04-04 DIAGNOSIS — R739 Hyperglycemia, unspecified: Secondary | ICD-10-CM

## 2023-04-04 LAB — CBC WITH DIFFERENTIAL/PLATELET
Basophils Absolute: 0.1 10*3/uL (ref 0.0–0.1)
Basophils Relative: 1.1 % (ref 0.0–3.0)
Eosinophils Absolute: 0.2 10*3/uL (ref 0.0–0.7)
Eosinophils Relative: 3.2 % (ref 0.0–5.0)
HCT: 43.7 % (ref 36.0–46.0)
Hemoglobin: 14.6 g/dL (ref 12.0–15.0)
Lymphocytes Relative: 40.2 % (ref 12.0–46.0)
Lymphs Abs: 2.2 10*3/uL (ref 0.7–4.0)
MCHC: 33.4 g/dL (ref 30.0–36.0)
MCV: 89.1 fL (ref 78.0–100.0)
Monocytes Absolute: 0.4 10*3/uL (ref 0.1–1.0)
Monocytes Relative: 7.2 % (ref 3.0–12.0)
Neutro Abs: 2.6 10*3/uL (ref 1.4–7.7)
Neutrophils Relative %: 48.3 % (ref 43.0–77.0)
Platelets: 169 10*3/uL (ref 150.0–400.0)
RBC: 4.91 Mil/uL (ref 3.87–5.11)
RDW: 13.6 % (ref 11.5–15.5)
WBC: 5.5 10*3/uL (ref 4.0–10.5)

## 2023-04-04 LAB — TSH: TSH: 16.42 u[IU]/mL — ABNORMAL HIGH (ref 0.35–5.50)

## 2023-04-04 LAB — T4, FREE: Free T4: 0.77 ng/dL (ref 0.60–1.60)

## 2023-04-04 LAB — COMPREHENSIVE METABOLIC PANEL
ALT: 10 U/L (ref 0–35)
AST: 13 U/L (ref 0–37)
Albumin: 4.6 g/dL (ref 3.5–5.2)
Alkaline Phosphatase: 81 U/L (ref 39–117)
BUN: 23 mg/dL (ref 6–23)
CO2: 27 meq/L (ref 19–32)
Calcium: 9.6 mg/dL (ref 8.4–10.5)
Chloride: 103 meq/L (ref 96–112)
Creatinine, Ser: 1.08 mg/dL (ref 0.40–1.20)
GFR: 54.7 mL/min — ABNORMAL LOW (ref >60.00)
Glucose, Bld: 87 mg/dL (ref 70–99)
Potassium: 4.3 meq/L (ref 3.5–5.1)
Sodium: 137 meq/L (ref 135–145)
Total Bilirubin: 0.6 mg/dL (ref 0.2–1.2)
Total Protein: 7.3 g/dL (ref 6.0–8.3)

## 2023-04-04 LAB — HEMOGLOBIN A1C: Hgb A1c MFr Bld: 6 % (ref 4.6–6.5)

## 2023-04-04 MED ORDER — NURTEC 75 MG PO TBDP
ORAL_TABLET | ORAL | 3 refills | Status: DC
Start: 2023-04-04 — End: 2023-08-03

## 2023-04-04 NOTE — Assessment & Plan Note (Signed)
On Synthroid

## 2023-04-04 NOTE — Assessment & Plan Note (Signed)
Worse Start Nurtec

## 2023-04-04 NOTE — Assessment & Plan Note (Signed)
Nephrology d/c'd the pt  GFR was 54

## 2023-04-04 NOTE — Assessment & Plan Note (Signed)
On B12

## 2023-04-04 NOTE — Assessment & Plan Note (Signed)
On Mounjaro  10 mg/wk  Check A1c

## 2023-04-04 NOTE — Assessment & Plan Note (Signed)
Doing better.

## 2023-04-04 NOTE — Progress Notes (Signed)
Subjective:  Patient ID: Autumn Howard, female    DOB: September 19, 1960  Age: 63 y.o. MRN: [id number]  CC: Medical Management of Chronic Issues (4 mnth f/u)  Migraine pounds HPI [name] presents for HAs - worse, thyroiditis, B12 def On Synthroid 100 mcg/d  Outpatient Medications Prior to Visit  Medication Sig Dispense Refill   Cholecalciferol (VITAMIN D3) 2000 units capsule Take 1 capsule (2,000 Units total) by mouth daily. 100 capsule 3   Cyanocobalamin (VITAMIN B-12) 1000 MCG SUBL Place 1 tablet (1,000 mcg total) under the tongue daily. 100 tablet 3   levothyroxine (SYNTHROID) 125 MCG tablet Take 1 tablet by mouth daily. Take 1 by mouth daily     Magnesium Oxide (MAG-OXIDE PO) Take 400 mg by mouth 2 (two) times daily. Take 1 tablet twice a day     ondansetron (ZOFRAN) 4 MG tablet Take 1 tablet (4 mg total) by mouth every 8 (eight) hours as needed for nausea. 30 tablet 1   oxyCODONE (OXY IR/ROXICODONE) 5 MG immediate release tablet Take by mouth.     rosuvastatin (CRESTOR) 5 MG tablet Take 5 mg by mouth daily. Take 1 by mouth daily     tirzepatide (MOUNJARO) 10 MG/0.5ML Pen Inject 10 mg into the skin once a week.     No facility-administered medications prior to visit.    ROS: Review of Systems  Constitutional:  Positive for unexpected weight change. Negative for activity change, appetite change, chills and fatigue.  HENT:  Negative for congestion, mouth sores and sinus pressure.   Eyes:  Negative for visual disturbance.  Respiratory:  Negative for cough and chest tightness.   Gastrointestinal:  Negative for abdominal pain and nausea.  Genitourinary:  Negative for difficulty urinating, frequency and vaginal pain.  Musculoskeletal:  Negative for back pain and gait problem.  Skin:  Negative for pallor and rash.  Neurological:  Positive for headaches. Negative for dizziness, tremors, weakness and numbness.  Psychiatric/Behavioral:  Negative for confusion and sleep disturbance.      Objective:  BP 110/80 (BP Location: Left Arm, Patient Position: Sitting, Cuff Size: Normal)   Pulse 77   Temp 98.6 F (37 C) (Oral)   Ht 5\' 9"  (1.753 m)   Wt 180 lb (81.6 kg)   LMP 12/22/2011   SpO2 99%   BMI 26.58 kg/m   BP Readings from Last 3 Encounters:  04/04/23 110/80  11/30/22 118/70  08/30/22 118/62    Wt Readings from Last 3 Encounters:  04/04/23 180 lb (81.6 kg)  11/30/22 173 lb (78.5 kg)  08/30/22 174 lb (78.9 kg)    Physical Exam Constitutional:      General: She is not in acute distress.    Appearance: She is well-developed. She is not ill-appearing or toxic-appearing.  HENT:     Head: Normocephalic.     Right Ear: External ear normal.     Left Ear: External ear normal.     Nose: Nose normal.  Eyes:     General:        Right eye: No discharge.        Left eye: No discharge.     Conjunctiva/sclera: Conjunctivae normal.     Pupils: Pupils are equal, round, and reactive to light.  Neck:     Thyroid: No thyromegaly.     Vascular: No JVD.     Trachea: No tracheal deviation.  Cardiovascular:     Rate and Rhythm: Normal rate and regular rhythm.  Heart sounds: Normal heart sounds.  Pulmonary:     Effort: No respiratory distress.     Breath sounds: No stridor. No wheezing.  Abdominal:     General: Bowel sounds are normal. There is no distension.     Palpations: Abdomen is soft. There is no mass.     Tenderness: There is no abdominal tenderness. There is no guarding or rebound.  Musculoskeletal:        General: No tenderness.     Cervical back: Normal range of motion and neck supple. No rigidity.  Lymphadenopathy:     Cervical: No cervical adenopathy.  Skin:    Findings: No erythema or rash.  Neurological:     Cranial Nerves: No cranial nerve deficit.     Motor: No abnormal muscle tone.     Coordination: Coordination normal.     Deep Tendon Reflexes: Reflexes normal.  Psychiatric:        Behavior: Behavior normal.        Thought Content:  Thought content normal.        Judgment: Judgment normal.     Lab Results  Component Value Date   WBC 9.9 01/26/2022   HGB 10.5 (L) 01/26/2022   HCT 32.2 (L) 01/26/2022   PLT 393.0 01/26/2022   GLUCOSE 106 (H) 08/30/2022   CHOL 252 (H) 06/22/2016   TRIG 340.0 (H) 06/22/2016   HDL 38.00 (L) 06/22/2016   LDLDIRECT 135.0 06/22/2016   LDLCALC 133 (H) 01/11/2012   ALT 7 08/30/2022   AST 13 08/30/2022   NA 141 08/30/2022   K 3.8 08/30/2022   CL 108 08/30/2022   CREATININE 1.28 (H) 08/30/2022   BUN 31 (H) 08/30/2022   CO2 28 08/30/2022   TSH 0.42 08/30/2022   INR 0.95 11/25/2009   HGBA1C 5.7 08/30/2022    VAS [place] LOWER EXTREMITY VENOUS (DVT) Result Date: 07/28/2022  Lower Venous DVT Study Patient Name:  Autumn Howard  Date of Exam:   07/28/2022 Medical Rec #: [id number]        Accession #:    [id number] Date of Birth: 07/01/1960        Patient Gender: F Patient Age:   37 years Exam Location:  [facility] Procedure:      VAS [place] LOWER EXTREMITY VENOUS (DVT) Referring Phys: Lanora Manis CRAWFORD --------------------------------------------------------------------------------  Indications: Edema, and denies SOB or chest pain. Other Indications: 6 months' treatment for PE with Eliquis, which was                    discontinued at the beginning of May 2024. She took a 5-hour                    car ride, and has now developed bilateral lower extremity                    edema plus a 12 pound weight gain. Risk Factors: Recent extended travel 01/05/2022. Comparison Study: 01/05/2022 was negative for DVT bilaterally. Performing Technologist: [employer] BS, RVT, RDCS  Examination Guidelines: A complete evaluation includes B-mode imaging, spectral Doppler, color Doppler, and power Doppler as needed of all accessible portions of each vessel. Bilateral testing is considered an integral part of a complete examination. Limited examinations for reoccurring indications may be performed as noted. The reflux  portion of the exam is performed with the patient in reverse Trendelenburg.  +---------+---------------+---------+-----------+----------+--------------+ RIGHT    CompressibilityPhasicitySpontaneityPropertiesThrombus Aging +---------+---------------+---------+-----------+----------+--------------+ CFV  Full           Yes      Yes                                 +---------+---------------+---------+-----------+----------+--------------+ SFJ      Full           Yes      Yes                                 +---------+---------------+---------+-----------+----------+--------------+ FV Prox  Full           Yes      Yes                                 +---------+---------------+---------+-----------+----------+--------------+ FV Mid   Full           Yes      Yes                                 +---------+---------------+---------+-----------+----------+--------------+ FV DistalFull           Yes      Yes                                 +---------+---------------+---------+-----------+----------+--------------+ PFV      Full           Yes      Yes                                 +---------+---------------+---------+-----------+----------+--------------+ POP      Full           Yes      Yes                                 +---------+---------------+---------+-----------+----------+--------------+ PTV      Full           Yes      Yes                                 +---------+---------------+---------+-----------+----------+--------------+ PERO     Full           Yes      Yes                                 +---------+---------------+---------+-----------+----------+--------------+ Gastroc  Full                                                        +---------+---------------+---------+-----------+----------+--------------+ GSV      Full                                                         +---------+---------------+---------+-----------+----------+--------------+   +---------+---------------+---------+-----------+----------+--------------+  LEFT     CompressibilityPhasicitySpontaneityPropertiesThrombus Aging +---------+---------------+---------+-----------+----------+--------------+ CFV      Full           Yes      Yes                                 +---------+---------------+---------+-----------+----------+--------------+ SFJ      Full           Yes      Yes                                 +---------+---------------+---------+-----------+----------+--------------+ FV Prox  Full           Yes      Yes                                 +---------+---------------+---------+-----------+----------+--------------+ FV Mid   Full           Yes      Yes                                 +---------+---------------+---------+-----------+----------+--------------+ FV DistalFull           Yes      Yes                                 +---------+---------------+---------+-----------+----------+--------------+ PFV      Full           Yes      Yes                                 +---------+---------------+---------+-----------+----------+--------------+ POP      Full           Yes      Yes                                 +---------+---------------+---------+-----------+----------+--------------+ PTV      Full           Yes      Yes                                 +---------+---------------+---------+-----------+----------+--------------+ PERO     Full           Yes      Yes                                 +---------+---------------+---------+-----------+----------+--------------+ Gastroc  Full                                                        +---------+---------------+---------+-----------+----------+--------------+ GSV      Full                                                         +---------+---------------+---------+-----------+----------+--------------+  Deep venous reflux noted in the mid and distal femoral vein, and the popliteal vein.   Findings reported to Dr. Okey Dupre through Epic routing at 10:05AM.  Summary: BILATERAL: - No evidence of deep vein thrombosis seen in the lower extremities, bilaterally. - No evidence of superficial venous thrombosis in the lower extremities, bilaterally. -  LEFT: - Deep venous reflux in the femoral and popliteal veins.  *See table(s) above for measurements and observations. Electronically signed by Lemar Livings MD on 07/28/2022 at 5:06:20 PM.    Final     Assessment & Plan:   Problem List Items Addressed This Visit     Hashimoto's thyroiditis - Primary (Chronic)   On Synthroid      Major depressive disorder, recurrent episode, moderate (HCC)   Doing better      Migraine   Worse Start Nurtec      Relevant Medications   Rimegepant Sulfate (NURTEC) 75 MG TBDP   Hyperglycemia   Labs - wt gain      B12 deficiency   On B12      Type 2 diabetes mellitus with obesity (HCC)   On Mounjaro  10 mg/wk  Check A1c      CRI (chronic renal insufficiency), stage 3 (moderate) (HCC)   Nephrology d/c'd the pt  GFR was 54         Meds ordered this encounter  Medications   Rimegepant Sulfate (NURTEC) 75 MG TBDP    Sig: 1 po qod prn    Dispense:  12 tablet    Refill:  3    Triptan intolerant, Ubrelvy did not work      Follow-up: Return in about 4 months (around 08/02/2023) for a follow-up visit.  Sonda Primes, MD

## 2023-04-04 NOTE — Assessment & Plan Note (Signed)
Labs - wt gain

## 2023-04-05 ENCOUNTER — Encounter: Payer: Self-pay | Admitting: Internal Medicine

## 2023-05-17 ENCOUNTER — Encounter: Payer: Self-pay | Admitting: Internal Medicine

## 2023-05-17 DIAGNOSIS — E034 Atrophy of thyroid (acquired): Secondary | ICD-10-CM

## 2023-05-17 DIAGNOSIS — E538 Deficiency of other specified B group vitamins: Secondary | ICD-10-CM

## 2023-05-31 IMAGING — US US RENAL
1 series · 14 of 25 positions shown · non-contrast
Comparison: None.

CLINICAL DATA: NAA

Diabetes mellitus
EXAM:
RENAL / URINARY TRACT ULTRASOUND COMPLETE

[Series 1: us renal · 0.19mm/px · 14 of 37 slices shown]
[im 1/37]
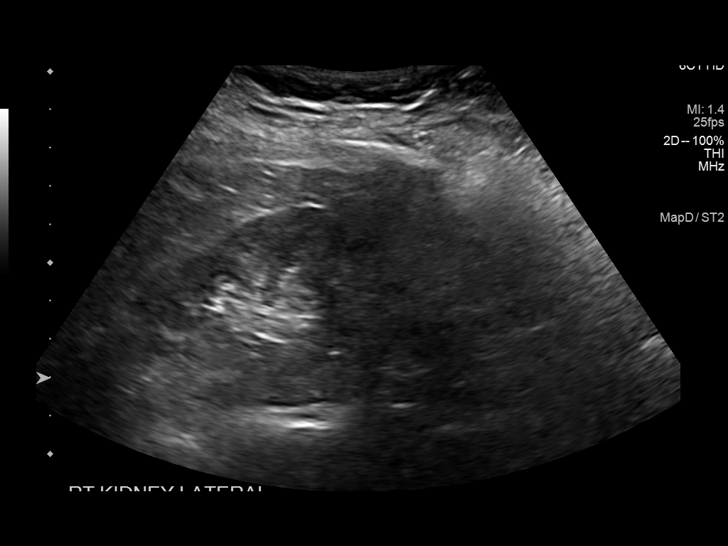
[im 4/37]
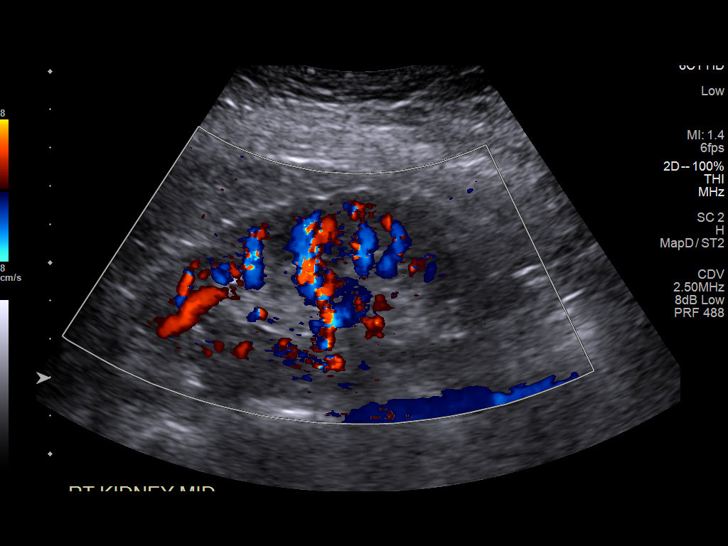
[im 7/37]
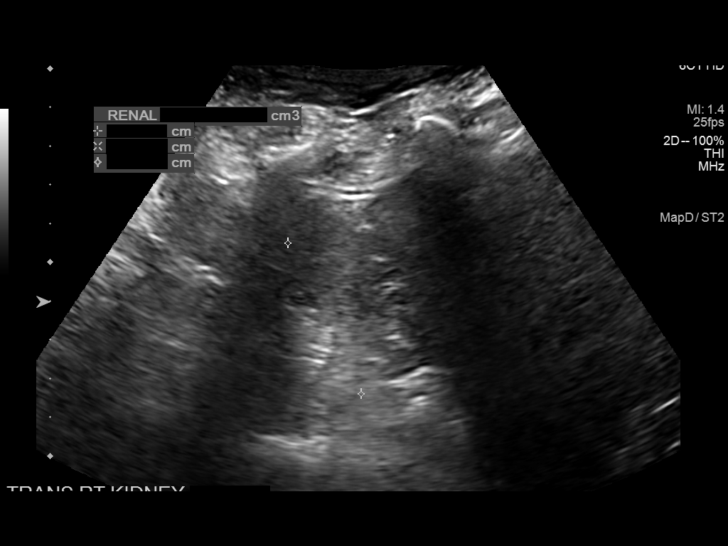
[im 10/37]
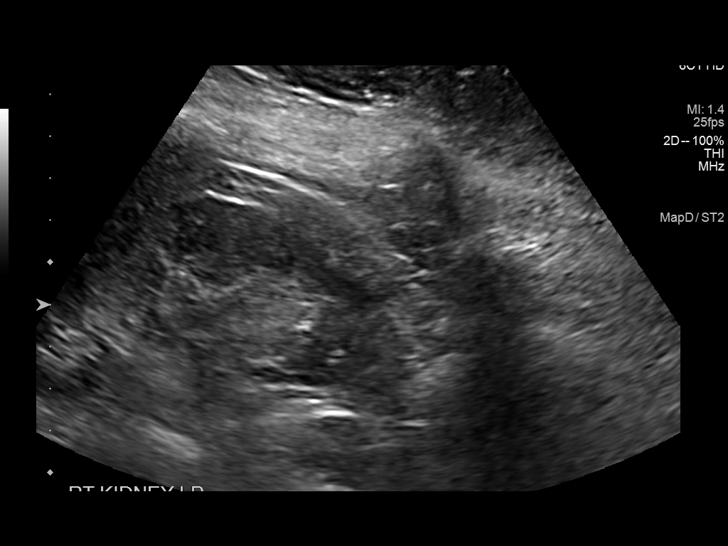
[im 13/37]
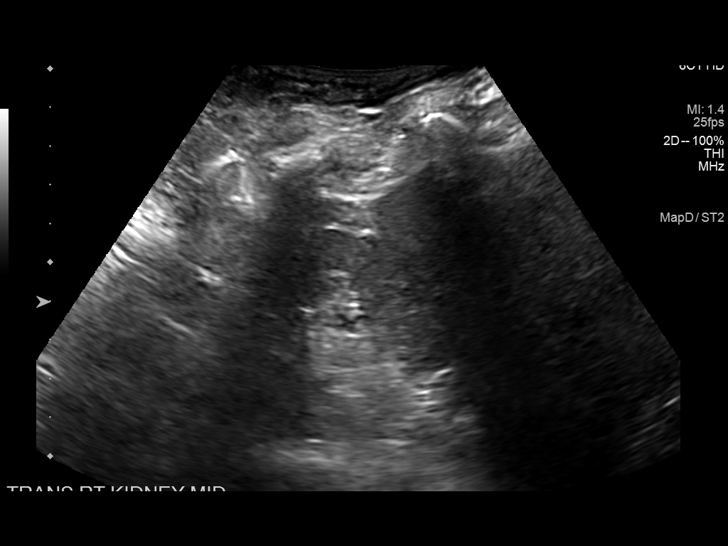
[im 14/37]
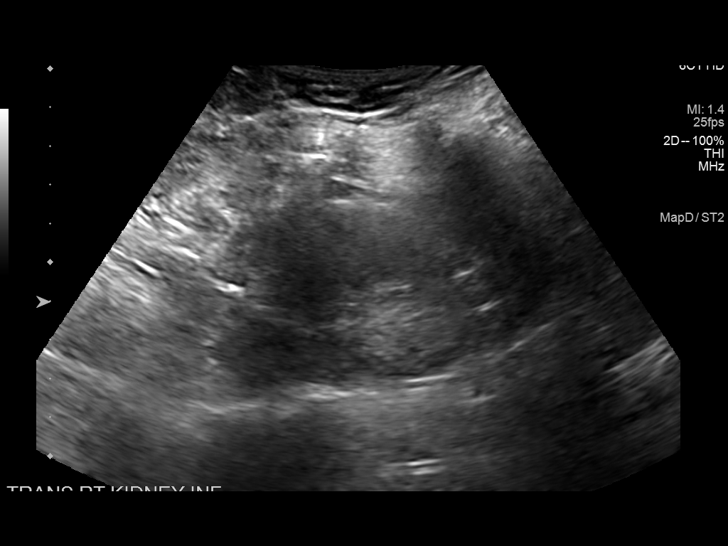
[im 17/37]
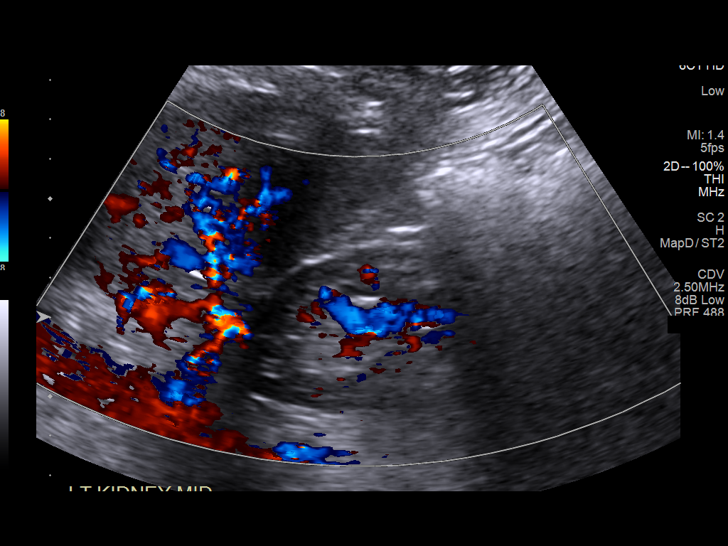
[im 20/37]
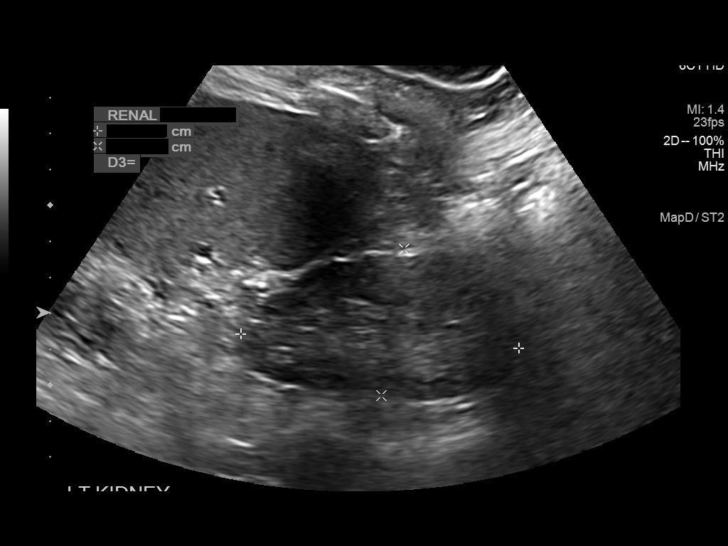
[im 23/37]
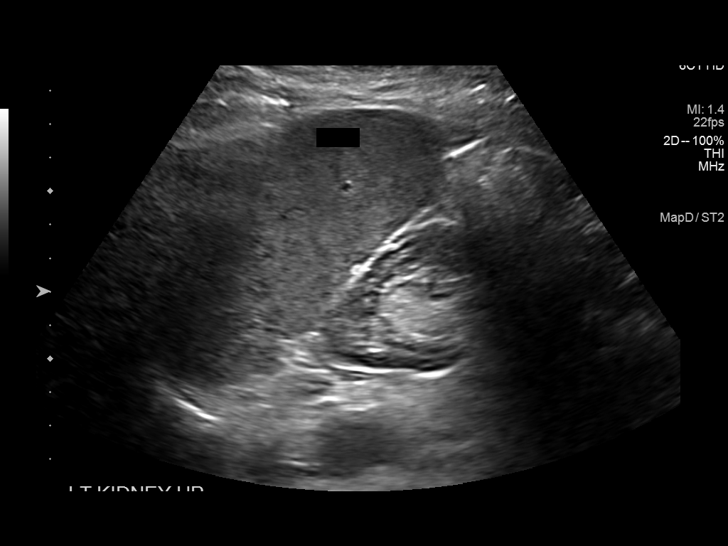
[im 25/37]
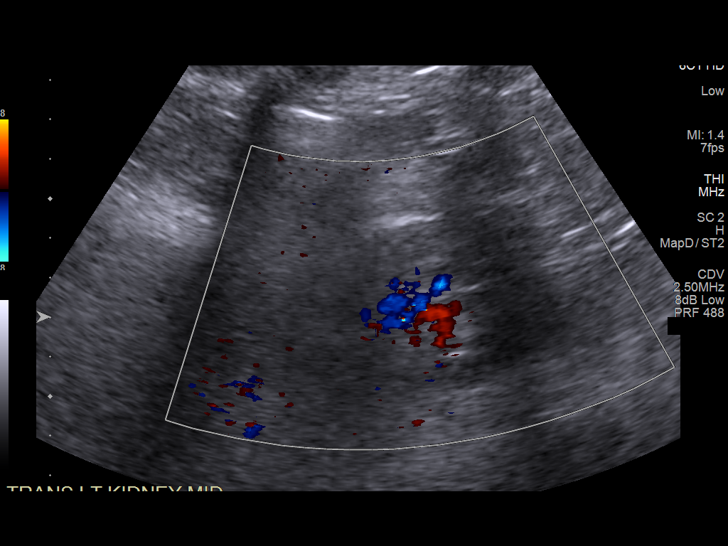
[im 28/37]
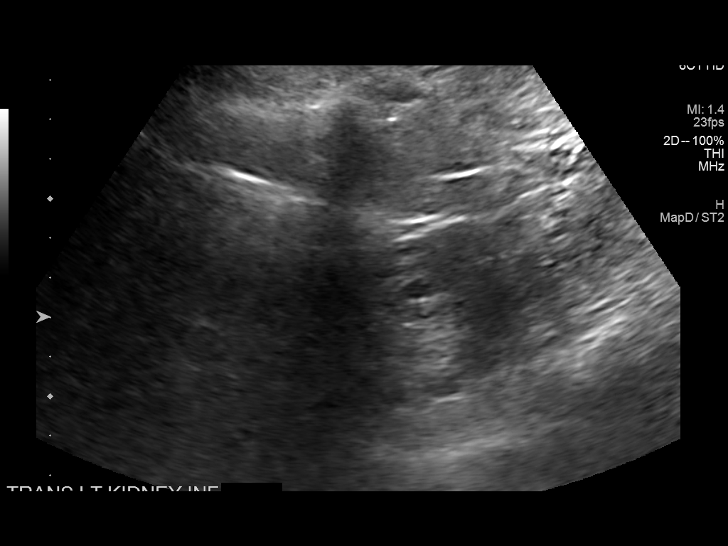
[im 31/37]
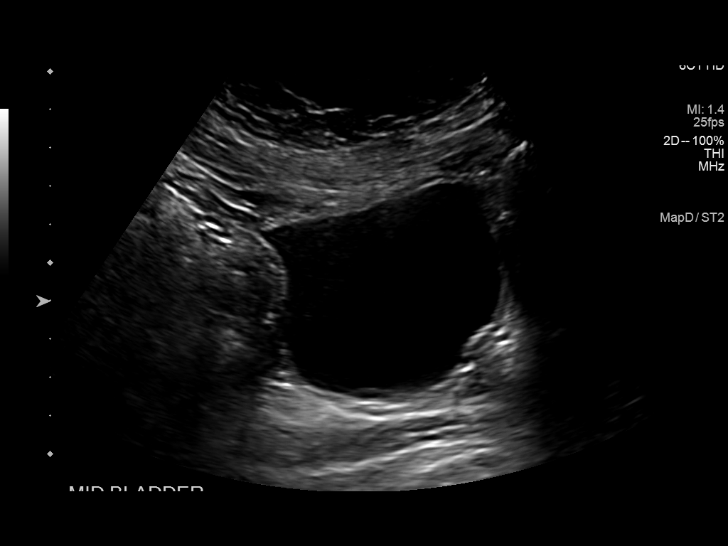
[im 34/37]
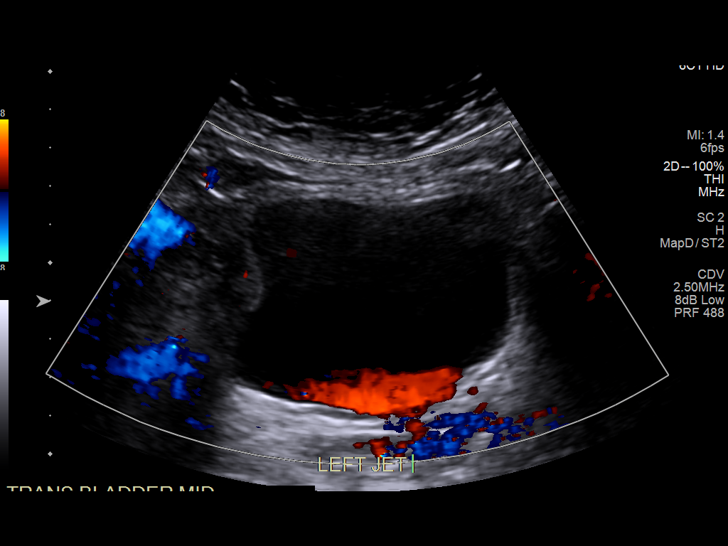
[im 37/37]
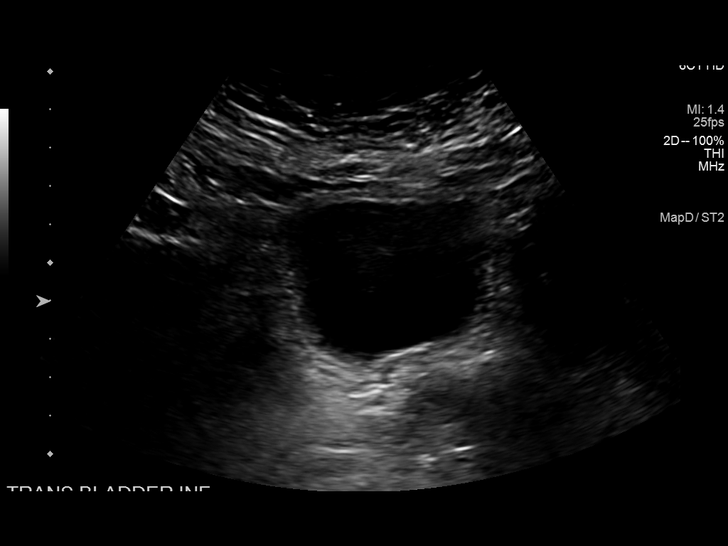

[14 of 25 positions shown; findings below may reference images not displayed]

FINDINGS: Right Kidney:

Renal measurements: 10.0 x 4.4 x 4.3 cm = volume: 99 mL.
Echogenicity within normal limits. No mass or hydronephrosis
visualized.

Left Kidney:

Renal measurements: 7.7 x 4.1 x 4.1 cm = volume: 68 mL. Echogenicity
within normal limits. No mass or hydronephrosis visualized.

Bladder:

Appears normal for degree of bladder distention.

Other:

None.
IMPRESSION: Asymmetrically smaller left kidney. Otherwise unremarkable renal
ultrasound.

## 2023-06-10 ENCOUNTER — Other Ambulatory Visit (INDEPENDENT_AMBULATORY_CARE_PROVIDER_SITE_OTHER): Payer: PRIVATE HEALTH INSURANCE

## 2023-06-10 DIAGNOSIS — E034 Atrophy of thyroid (acquired): Secondary | ICD-10-CM

## 2023-06-10 DIAGNOSIS — E538 Deficiency of other specified B group vitamins: Secondary | ICD-10-CM

## 2023-06-10 LAB — T4, FREE: Free T4: 0.72 ng/dL (ref 0.60–1.60)

## 2023-06-10 LAB — TSH: TSH: 5.34 u[IU]/mL (ref 0.35–5.50)

## 2023-06-10 LAB — VITAMIN B12: Vitamin B-12: 528 pg/mL (ref 211–911)

## 2023-06-13 ENCOUNTER — Encounter: Payer: Self-pay | Admitting: Internal Medicine

## 2023-08-02 ENCOUNTER — Ambulatory Visit: Payer: PRIVATE HEALTH INSURANCE | Admitting: Internal Medicine

## 2023-08-03 ENCOUNTER — Encounter: Payer: Self-pay | Admitting: Internal Medicine

## 2023-08-03 ENCOUNTER — Telehealth: Payer: Self-pay

## 2023-08-03 ENCOUNTER — Ambulatory Visit (INDEPENDENT_AMBULATORY_CARE_PROVIDER_SITE_OTHER): Payer: PRIVATE HEALTH INSURANCE | Admitting: Internal Medicine

## 2023-08-03 ENCOUNTER — Other Ambulatory Visit (HOSPITAL_COMMUNITY): Payer: Self-pay

## 2023-08-03 VITALS — BP 110/70 | HR 73 | Temp 98.2°F | Ht 69.0 in | Wt 179.0 lb

## 2023-08-03 DIAGNOSIS — I2699 Other pulmonary embolism without acute cor pulmonale: Secondary | ICD-10-CM

## 2023-08-03 DIAGNOSIS — E538 Deficiency of other specified B group vitamins: Secondary | ICD-10-CM

## 2023-08-03 DIAGNOSIS — E669 Obesity, unspecified: Secondary | ICD-10-CM

## 2023-08-03 DIAGNOSIS — F5101 Primary insomnia: Secondary | ICD-10-CM

## 2023-08-03 DIAGNOSIS — Z7985 Long-term (current) use of injectable non-insulin antidiabetic drugs: Secondary | ICD-10-CM

## 2023-08-03 DIAGNOSIS — R7989 Other specified abnormal findings of blood chemistry: Secondary | ICD-10-CM

## 2023-08-03 DIAGNOSIS — E1169 Type 2 diabetes mellitus with other specified complication: Secondary | ICD-10-CM | POA: Diagnosis not present

## 2023-08-03 DIAGNOSIS — E034 Atrophy of thyroid (acquired): Secondary | ICD-10-CM

## 2023-08-03 DIAGNOSIS — Z6826 Body mass index (BMI) 26.0-26.9, adult: Secondary | ICD-10-CM

## 2023-08-03 MED ORDER — NURTEC 75 MG PO TBDP: ORAL_TABLET | ORAL | 3 refills | Status: AC

## 2023-08-03 NOTE — Telephone Encounter (Signed)
 Pharmacy Patient Advocate Encounter   Received notification from CoverMyMeds that prior authorization for Nurtec 75 is required/requested.   Insurance verification completed.   The patient is insured through [employer] .   Per test claim: PA required; PA submitted to above mentioned insurance via CoverMyMeds Key/confirmation #/EOC [id number] Status is pending

## 2023-08-03 NOTE — Assessment & Plan Note (Signed)
 On Mounjaro  10 mg/wk  Check A1c

## 2023-08-03 NOTE — Assessment & Plan Note (Signed)
Chronic    On Synthroid 125 mcg daily

## 2023-08-03 NOTE — Progress Notes (Signed)
 Subjective:  Patient ID: Autumn Howard, female    DOB: May 17, 1960  Age: 63 y.o. MRN: [id number]  CC: Medical Management of Chronic Issues (4 MNTH F/U)   HPI Autumn Howard presents for DM, Hashimoto's, HAs, CRI Working 12 h shift  Outpatient Medications Prior to Visit  Medication Sig Dispense Refill   Cholecalciferol (VITAMIN D3) 2000 units capsule Take 1 capsule (2,000 Units total) by mouth daily. 100 capsule 3   Cyanocobalamin  (VITAMIN B-12) 1000 MCG SUBL Place 1 tablet (1,000 mcg total) under the tongue daily. 100 tablet 3   levothyroxine  (SYNTHROID ) 125 MCG tablet Take 1 tablet by mouth daily. Take 1 by mouth daily     Magnesium Oxide (MAG-OXIDE PO) Take 400 mg by mouth 2 (two) times daily. Take 1 tablet twice a day     ondansetron  (ZOFRAN ) 4 MG tablet Take 1 tablet (4 mg total) by mouth every 8 (eight) hours as needed for nausea. 30 tablet 1   oxyCODONE  (OXY IR/ROXICODONE ) 5 MG immediate release tablet Take by mouth.     rosuvastatin (CRESTOR) 5 MG tablet Take 5 mg by mouth daily. Take 1 by mouth daily     senna (SENOKOT) 8.6 MG tablet Take 8.6 mg by mouth.     tirzepatide  (MOUNJARO ) 10 MG/0.5ML Pen Inject 10 mg into the skin once a week.     Rimegepant Sulfate (NURTEC) 75 MG TBDP 1 po qod prn 12 tablet 3   No facility-administered medications prior to visit.    ROS: Review of Systems  Constitutional:  Positive for fatigue. Negative for activity change, appetite change, chills and unexpected weight change.  HENT:  Negative for congestion, mouth sores and sinus pressure.   Eyes:  Negative for visual disturbance.  Respiratory:  Negative for cough and chest tightness.   Gastrointestinal:  Negative for abdominal pain and nausea.  Genitourinary:  Negative for difficulty urinating, frequency and vaginal pain.  Musculoskeletal:  Negative for back pain and gait problem.  Skin:  Negative for pallor and rash.  Neurological:  Positive for headaches. Negative for dizziness, tremors,  weakness and numbness.  Psychiatric/Behavioral:  Negative for confusion and sleep disturbance.     Objective:  BP 110/70   Pulse 73   Temp 98.2 F (36.8 C) (Oral)   Ht 5\' 9"  (1.753 m)   Wt 179 lb (81.2 kg)   LMP 12/22/2011   SpO2 95%   BMI 26.43 kg/m   BP Readings from Last 3 Encounters:  08/03/23 110/70  04/04/23 110/80  11/30/22 118/70    Wt Readings from Last 3 Encounters:  08/03/23 179 lb (81.2 kg)  04/04/23 180 lb (81.6 kg)  11/30/22 173 lb (78.5 kg)    Physical Exam Constitutional:      General: She is not in acute distress.    Appearance: She is well-developed.  HENT:     Head: Normocephalic.     Right Ear: External ear normal.     Left Ear: External ear normal.     Nose: Nose normal.  Eyes:     General:        Right eye: No discharge.        Left eye: No discharge.     Conjunctiva/sclera: Conjunctivae normal.     Pupils: Pupils are equal, round, and reactive to light.  Neck:     Thyroid : No thyromegaly.     Vascular: No JVD.     Trachea: No tracheal deviation.  Cardiovascular:     Rate  and Rhythm: Normal rate and regular rhythm.     Heart sounds: Normal heart sounds.  Pulmonary:     Effort: No respiratory distress.     Breath sounds: No stridor. No wheezing.  Abdominal:     General: Bowel sounds are normal. There is no distension.     Palpations: Abdomen is soft. There is no mass.     Tenderness: There is no abdominal tenderness. There is no guarding or rebound.  Musculoskeletal:        General: No tenderness.     Cervical back: Normal range of motion and neck supple. No rigidity.     Right lower leg: No edema.     Left lower leg: No edema.  Lymphadenopathy:     Cervical: No cervical adenopathy.  Skin:    Findings: No erythema or rash.  Neurological:     Cranial Nerves: No cranial nerve deficit.     Motor: No abnormal muscle tone.     Coordination: Coordination normal.     Deep Tendon Reflexes: Reflexes normal.  Psychiatric:         Behavior: Behavior normal.        Thought Content: Thought content normal.        Judgment: Judgment normal.     Lab Results  Component Value Date   WBC 5.5 04/04/2023   HGB 14.6 04/04/2023   HCT 43.7 04/04/2023   PLT 169.0 04/04/2023   GLUCOSE 87 04/04/2023   CHOL 252 (H) 06/22/2016   TRIG 340.0 (H) 06/22/2016   HDL 38.00 (L) 06/22/2016   LDLDIRECT 135.0 06/22/2016   LDLCALC 133 (H) 01/11/2012   ALT 10 04/04/2023   AST 13 04/04/2023   NA 137 04/04/2023   K 4.3 04/04/2023   CL 103 04/04/2023   CREATININE 1.08 04/04/2023   BUN 23 04/04/2023   CO2 27 04/04/2023   TSH 5.34 06/10/2023   INR 0.95 11/25/2009   HGBA1C 6.0 04/04/2023    VAS US  LOWER EXTREMITY VENOUS (DVT) Result Date: 07/28/2022  Lower Venous DVT Study Patient Name:  Autumn Howard  Date of Exam:   07/28/2022 Medical Rec #: [id number]        Accession #:    [id number] Date of Birth: 09-02-1960        Patient Gender: F Patient Age:   79 years Exam Location:  [facility] Procedure:      VAS US  LOWER EXTREMITY VENOUS (DVT) Referring Phys: Nellie Banas CRAWFORD --------------------------------------------------------------------------------  Indications: Edema, and denies SOB or chest pain. Other Indications: 6 months' treatment for PE with Eliquis , which was                    discontinued at the beginning of May 2024. She took a 5-hour                    car ride, and has now developed bilateral lower extremity                    edema plus a 12 pound weight gain. Risk Factors: Recent extended travel 01/05/2022. Comparison Study: 01/05/2022 was negative for DVT bilaterally. Performing Technologist: [employer] BS, RVT, RDCS  Examination Guidelines: A complete evaluation includes B-mode imaging, spectral Doppler, color Doppler, and power Doppler as needed of all accessible portions of each vessel. Bilateral testing is considered an integral part of a complete examination. Limited examinations for reoccurring indications may be  performed as noted. The reflux portion of the  exam is performed with the patient in reverse Trendelenburg.  +---------+---------------+---------+-----------+----------+--------------+ RIGHT    CompressibilityPhasicitySpontaneityPropertiesThrombus Aging +---------+---------------+---------+-----------+----------+--------------+ CFV      Full           Yes      Yes                                 +---------+---------------+---------+-----------+----------+--------------+ SFJ      Full           Yes      Yes                                 +---------+---------------+---------+-----------+----------+--------------+ FV Prox  Full           Yes      Yes                                 +---------+---------------+---------+-----------+----------+--------------+ FV Mid   Full           Yes      Yes                                 +---------+---------------+---------+-----------+----------+--------------+ FV DistalFull           Yes      Yes                                 +---------+---------------+---------+-----------+----------+--------------+ PFV      Full           Yes      Yes                                 +---------+---------------+---------+-----------+----------+--------------+ POP      Full           Yes      Yes                                 +---------+---------------+---------+-----------+----------+--------------+ PTV      Full           Yes      Yes                                 +---------+---------------+---------+-----------+----------+--------------+ PERO     Full           Yes      Yes                                 +---------+---------------+---------+-----------+----------+--------------+ Gastroc  Full                                                        +---------+---------------+---------+-----------+----------+--------------+ GSV      Full                                                         +---------+---------------+---------+-----------+----------+--------------+   +---------+---------------+---------+-----------+----------+--------------+  LEFT     CompressibilityPhasicitySpontaneityPropertiesThrombus Aging +---------+---------------+---------+-----------+----------+--------------+ CFV      Full           Yes      Yes                                 +---------+---------------+---------+-----------+----------+--------------+ SFJ      Full           Yes      Yes                                 +---------+---------------+---------+-----------+----------+--------------+ FV Prox  Full           Yes      Yes                                 +---------+---------------+---------+-----------+----------+--------------+ FV Mid   Full           Yes      Yes                                 +---------+---------------+---------+-----------+----------+--------------+ FV DistalFull           Yes      Yes                                 +---------+---------------+---------+-----------+----------+--------------+ PFV      Full           Yes      Yes                                 +---------+---------------+---------+-----------+----------+--------------+ POP      Full           Yes      Yes                                 +---------+---------------+---------+-----------+----------+--------------+ PTV      Full           Yes      Yes                                 +---------+---------------+---------+-----------+----------+--------------+ PERO     Full           Yes      Yes                                 +---------+---------------+---------+-----------+----------+--------------+ Gastroc  Full                                                        +---------+---------------+---------+-----------+----------+--------------+ GSV      Full                                                         +---------+---------------+---------+-----------+----------+--------------+  Deep venous reflux noted in the mid and distal femoral vein, and the popliteal vein.   Findings reported to Dr. Nicolette Barrio through Epic routing at 10:05AM.  Summary: BILATERAL: - No evidence of deep vein thrombosis seen in the lower extremities, bilaterally. - No evidence of superficial venous thrombosis in the lower extremities, bilaterally. -  LEFT: - Deep venous reflux in the femoral and popliteal veins.  *See table(s) above for measurements and observations. Electronically signed by Angela Kell MD on 07/28/2022 at 5:06:20 PM.    Final     Assessment & Plan:   Problem List Items Addressed This Visit     B12 deficiency - Primary   On B12      Hypothyroidism   Chronic    On Synthroid  125 mcg daily      Type 2 diabetes mellitus with obesity (HCC)   On Mounjaro   10 mg/wk  Check A1c      Elevated serum creatinine   Hydrate well Monitor GFR      Insomnia   Sart Zolpidem  prn  Potential benefits of a long term benzodiazepines  use as well as potential risks  and complications were explained to the patient and were aknowledged.       Pulmonary embolism (HCC)   On Eliquis          Meds ordered this encounter  Medications   Rimegepant Sulfate (NURTEC) 75 MG TBDP    Sig: 1 po qod prn    Dispense:  12 tablet    Refill:  3    Triptan intolerant, Ubrelvy  did not work      Follow-up: Return in about 3 months (around 11/03/2023) for a follow-up visit.  Anitra Barn, MD

## 2023-08-03 NOTE — Assessment & Plan Note (Signed)
Sart Zolpidem prn  Potential benefits of a long term benzodiazepines  use as well as potential risks  and complications were explained to the patient and were aknowledged.

## 2023-08-03 NOTE — Assessment & Plan Note (Signed)
Hydrate well ?Monitor GFR ?

## 2023-08-03 NOTE — Telephone Encounter (Signed)
 Pharmacy Patient [facility] Encounter  Received notification from [facility] that Prior Authorization for Nurtec 75 has been APPROVED from 08/03/23 to 11/01/23. Unable to obtain price due to refill too soon rejection, last fill date 08/03/23 next available fill date6/21/25   PA #/Case ID/Reference #: [id number]

## 2023-08-03 NOTE — Assessment & Plan Note (Signed)
 On Eliquis

## 2023-08-03 NOTE — Assessment & Plan Note (Signed)
 On B12

## 2023-10-21 ENCOUNTER — Other Ambulatory Visit (HOSPITAL_COMMUNITY): Payer: Self-pay

## 2024-02-06 ENCOUNTER — Ambulatory Visit: Payer: PRIVATE HEALTH INSURANCE | Admitting: Internal Medicine

## 2024-02-08 ENCOUNTER — Encounter: Payer: Self-pay | Admitting: Internal Medicine

## 2024-02-08 ENCOUNTER — Ambulatory Visit: Payer: PRIVATE HEALTH INSURANCE | Admitting: Internal Medicine

## 2024-02-08 VITALS — BP 126/82 | HR 85 | Temp 97.7°F | Ht 69.0 in | Wt 172.0 lb

## 2024-02-08 DIAGNOSIS — B029 Zoster without complications: Secondary | ICD-10-CM | POA: Insufficient documentation

## 2024-02-08 DIAGNOSIS — F4323 Adjustment disorder with mixed anxiety and depressed mood: Secondary | ICD-10-CM

## 2024-02-08 DIAGNOSIS — N2889 Other specified disorders of kidney and ureter: Secondary | ICD-10-CM

## 2024-02-08 DIAGNOSIS — E538 Deficiency of other specified B group vitamins: Secondary | ICD-10-CM

## 2024-02-08 MED ORDER — TRIAMCINOLONE ACETONIDE 0.5 % EX CREA: 1.0000 | TOPICAL_CREAM | Freq: Four times a day (QID) | CUTANEOUS | 1 refills | Status: AC

## 2024-02-08 MED ORDER — HYDROCODONE-ACETAMINOPHEN 7.5-325 MG PO TABS: 1.0000 | ORAL_TABLET | Freq: Four times a day (QID) | ORAL | 0 refills | Status: AC | PRN

## 2024-02-08 MED ORDER — VALACYCLOVIR HCL 1 G PO TABS: 1000.0000 mg | ORAL_TABLET | Freq: Three times a day (TID) | ORAL | 1 refills | Status: DC

## 2024-02-08 NOTE — Assessment & Plan Note (Signed)
Hydrate well ?Monitor GFR ?

## 2024-02-08 NOTE — Assessment & Plan Note (Signed)
 [facility]

## 2024-02-08 NOTE — Assessment & Plan Note (Signed)
 On B12

## 2024-02-08 NOTE — Assessment & Plan Note (Signed)
Chronic  Potential benefits of a long term benzodiazepines  use as well as potential risks  and complications were explained to the patient and were aknowledged.

## 2024-02-08 NOTE — Progress Notes (Unsigned)
 Subjective:  Patient ID: Autumn Howard, female    DOB: Oct 10, 1960  Age: 63 y.o. MRN: [id number]  CC: Medical Management of Chronic Issues (55mo follow up; /Think I have shingles; started out with pain in right shoulder going into arm about a week ago; went to UC got meds that didn't help but then seen ortho; but last night noticed little clusters of spots on right arm that are itchy and painful )   HPI Autumn Howard presents for R shoulder pain Pt took medrol  pack from UC, then she saw Dr Dozier: Tramadol, Flexeril PT  Medical Management of Chronic Issues (55mo follow up; Think I have shingles; started out with pain in right shoulder going into arm about a week ago; went to UC got meds that didn't help but then seen ortho; but last night noticed little clusters of spots on right arm that are itchy and painful x 1 day  Pain is 8/10   Outpatient Medications Prior to Visit  Medication Sig Dispense Refill   Cholecalciferol (VITAMIN D3) 2000 units capsule Take 1 capsule (2,000 Units total) by mouth daily. 100 capsule 3   Cyanocobalamin  (VITAMIN B-12) 1000 MCG SUBL Place 1 tablet (1,000 mcg total) under the tongue daily. 100 tablet 3   cyclobenzaprine (FLEXERIL) 10 MG tablet Take 10 mg by mouth as needed.     levothyroxine  (SYNTHROID ) 125 MCG tablet Take 1 tablet by mouth daily. Take 1 by mouth daily     Magnesium Oxide (MAG-OXIDE PO) Take 400 mg by mouth 2 (two) times daily. Take 1 tablet twice a day     ondansetron  (ZOFRAN ) 4 MG tablet Take 1 tablet (4 mg total) by mouth every 8 (eight) hours as needed for nausea. 30 tablet 1   oxyCODONE  (OXY IR/ROXICODONE ) 5 MG immediate release tablet Take by mouth.     Rimegepant Sulfate (NURTEC) 75 MG TBDP 1 po qod prn 12 tablet 3   rosuvastatin (CRESTOR) 5 MG tablet Take 5 mg by mouth daily. Take 1 by mouth daily     tirzepatide  (MOUNJARO ) 10 MG/0.5ML Pen Inject 10 mg into the skin once a week.     traMADol (ULTRAM) 50 MG tablet Take 50 mg by  mouth as needed.     No facility-administered medications prior to visit.    ROS: Review of Systems  Constitutional:  Positive for fatigue. Negative for activity change, appetite change, chills and unexpected weight change.  HENT:  Negative for congestion, mouth sores and sinus pressure.   Eyes:  Negative for visual disturbance.  Respiratory:  Negative for cough and chest tightness.   Gastrointestinal:  Negative for abdominal pain and nausea.  Genitourinary:  Negative for difficulty urinating, frequency and vaginal pain.  Musculoskeletal:  Positive for arthralgias. Negative for back pain and gait problem.  Skin:  Positive for rash. Negative for pallor.  Neurological:  Negative for dizziness, tremors, weakness, numbness and headaches.  Psychiatric/Behavioral:  Negative for confusion and sleep disturbance.     Objective:  BP 126/82   Pulse 85   Temp 97.7 F (36.5 C)   Ht 5' 9 (1.753 m)   Wt 172 lb (78 kg)   LMP 12/22/2011   SpO2 99%   BMI 25.40 kg/m   BP Readings from Last 3 Encounters:  02/08/24 126/82  08/03/23 110/70  04/04/23 110/80    Wt Readings from Last 3 Encounters:  02/08/24 172 lb (78 kg)  08/03/23 179 lb (81.2 kg)  04/04/23 180 lb (81.6 kg)  Physical Exam Constitutional:      General: She is not in acute distress.    Appearance: She is well-developed.  HENT:     Head: Normocephalic.     Right Ear: External ear normal.     Left Ear: External ear normal.     Nose: Nose normal.  Eyes:     General:        Right eye: No discharge.        Left eye: No discharge.     Conjunctiva/sclera: Conjunctivae normal.     Pupils: Pupils are equal, round, and reactive to light.  Neck:     Thyroid : No thyromegaly.     Vascular: No JVD.     Trachea: No tracheal deviation.  Cardiovascular:     Rate and Rhythm: Normal rate and regular rhythm.     Heart sounds: Normal heart sounds.  Pulmonary:     Effort: No respiratory distress.     Breath sounds: No stridor.  No wheezing.  Abdominal:     General: Bowel sounds are normal. There is no distension.     Palpations: Abdomen is soft. There is no mass.     Tenderness: There is no abdominal tenderness. There is no guarding or rebound.  Musculoskeletal:        General: No tenderness.     Cervical back: Normal range of motion and neck supple. No rigidity.  Lymphadenopathy:     Cervical: No cervical adenopathy.  Skin:    Findings: No erythema or rash.  Neurological:     Cranial Nerves: No cranial nerve deficit.     Motor: No abnormal muscle tone.     Coordination: Coordination normal.     Deep Tendon Reflexes: Reflexes normal.  Psychiatric:        Behavior: Behavior normal.        Thought Content: Thought content normal.        Judgment: Judgment normal.    R arm rash w/blisters   Lab Results  Component Value Date   WBC 5.5 04/04/2023   HGB 14.6 04/04/2023   HCT 43.7 04/04/2023   PLT 169.0 04/04/2023   GLUCOSE 87 04/04/2023   CHOL 252 (H) 06/22/2016   TRIG 340.0 (H) 06/22/2016   HDL 38.00 (L) 06/22/2016   LDLDIRECT 135.0 06/22/2016   LDLCALC 133 (H) 01/11/2012   ALT 10 04/04/2023   AST 13 04/04/2023   NA 137 04/04/2023   K 4.3 04/04/2023   CL 103 04/04/2023   CREATININE 1.08 04/04/2023   BUN 23 04/04/2023   CO2 27 04/04/2023   TSH 5.34 06/10/2023   INR 0.95 11/25/2009   HGBA1C 6.0 04/04/2023    VAS US  LOWER EXTREMITY VENOUS (DVT) Result Date: 07/28/2022  Lower Venous DVT Study Patient Name:  Autumn Howard  Date of Exam:   07/28/2022 Medical Rec #: [id number]        Accession #:    [id number] Date of Birth: September 13, 1960        Patient Gender: F Patient Age:   50 years Exam Location:  [facility] Procedure:      VAS US  LOWER EXTREMITY VENOUS (DVT) Referring Phys: Autumn Howard --------------------------------------------------------------------------------  Indications: Edema, and denies SOB or chest pain. Other Indications: 6 months' treatment for PE with Eliquis , which was                     discontinued at the beginning of May 2024. She took a 5-hour  car ride, and has now developed bilateral lower extremity                    edema plus a 12 pound weight gain. Risk Factors: Recent extended travel 01/05/2022. Comparison Study: 01/05/2022 was negative for DVT bilaterally. Performing Technologist: [employer] BS, RVT, RDCS  Examination Guidelines: A complete evaluation includes B-mode imaging, spectral Doppler, color Doppler, and power Doppler as needed of all accessible portions of each vessel. Bilateral testing is considered an integral part of a complete examination. Limited examinations for reoccurring indications may be performed as noted. The reflux portion of the exam is performed with the patient in reverse Trendelenburg.  +---------+---------------+---------+-----------+----------+--------------+ RIGHT    CompressibilityPhasicitySpontaneityPropertiesThrombus Aging +---------+---------------+---------+-----------+----------+--------------+ CFV      Full           Yes      Yes                                 +---------+---------------+---------+-----------+----------+--------------+ SFJ      Full           Yes      Yes                                 +---------+---------------+---------+-----------+----------+--------------+ FV Prox  Full           Yes      Yes                                 +---------+---------------+---------+-----------+----------+--------------+ FV Mid   Full           Yes      Yes                                 +---------+---------------+---------+-----------+----------+--------------+ FV DistalFull           Yes      Yes                                 +---------+---------------+---------+-----------+----------+--------------+ PFV      Full           Yes      Yes                                 +---------+---------------+---------+-----------+----------+--------------+ POP      Full            Yes      Yes                                 +---------+---------------+---------+-----------+----------+--------------+ PTV      Full           Yes      Yes                                 +---------+---------------+---------+-----------+----------+--------------+ PERO     Full           Yes      Yes                                 +---------+---------------+---------+-----------+----------+--------------+  Gastroc  Full                                                        +---------+---------------+---------+-----------+----------+--------------+ GSV      Full                                                        +---------+---------------+---------+-----------+----------+--------------+   +---------+---------------+---------+-----------+----------+--------------+ LEFT     CompressibilityPhasicitySpontaneityPropertiesThrombus Aging +---------+---------------+---------+-----------+----------+--------------+ CFV      Full           Yes      Yes                                 +---------+---------------+---------+-----------+----------+--------------+ SFJ      Full           Yes      Yes                                 +---------+---------------+---------+-----------+----------+--------------+ FV Prox  Full           Yes      Yes                                 +---------+---------------+---------+-----------+----------+--------------+ FV Mid   Full           Yes      Yes                                 +---------+---------------+---------+-----------+----------+--------------+ FV DistalFull           Yes      Yes                                 +---------+---------------+---------+-----------+----------+--------------+ PFV      Full           Yes      Yes                                 +---------+---------------+---------+-----------+----------+--------------+ POP      Full           Yes      Yes                                  +---------+---------------+---------+-----------+----------+--------------+ PTV      Full           Yes      Yes                                 +---------+---------------+---------+-----------+----------+--------------+ PERO     Full           Yes  Yes                                 +---------+---------------+---------+-----------+----------+--------------+ Gastroc  Full                                                        +---------+---------------+---------+-----------+----------+--------------+ GSV      Full                                                        +---------+---------------+---------+-----------+----------+--------------+ Deep venous reflux noted in the mid and distal femoral vein, and the popliteal vein.   Findings reported to Dr. Rollene through Epic routing at 10:05AM.  Summary: BILATERAL: - No evidence of deep vein thrombosis seen in the lower extremities, bilaterally. - No evidence of superficial venous thrombosis in the lower extremities, bilaterally. -  LEFT: - Deep venous reflux in the femoral and popliteal veins.  *See table(s) above for measurements and observations. Electronically signed by Penne Colorado MD on 07/28/2022 at 5:06:20 PM.    Final     Assessment & Plan:   Problem List Items Addressed This Visit     Adjustment disorder with mixed anxiety and depressed mood   Chronic   Potential benefits of a long term benzodiazepines  use as well as potential risks  and complications were explained to the patient and were aknowledged.      B12 deficiency   On B12      CRI (chronic renal insufficiency), stage 3 (moderate)   Hydrate well Monitor GFR.      Shingles outbreak - Primary   R arm Va         Meds ordered this encounter  Medications   DISCONTD: valACYclovir  (VALTREX ) 1000 MG tablet    Sig: Take 1 tablet (1,000 mg total) by mouth 3 (three) times daily.    Dispense:  21 tablet    Refill:  1    HYDROcodone -acetaminophen  (NORCO) 7.5-325 MG tablet    Sig: Take 1 tablet by mouth every 6 (six) hours as needed for moderate pain (pain score 4-6).    Dispense:  20 tablet    Refill:  0    shingles   triamcinolone  cream (KENALOG ) 0.5 %    Sig: Apply 1 Application topically 4 (four) times daily.    Dispense:  90 g    Refill:  1      Follow-up: Return in about 6 months (around 08/08/2024) for Wellness Exam.  Marolyn Noel, MD

## 2024-02-15 ENCOUNTER — Telehealth: Payer: Self-pay

## 2024-02-15 NOTE — Telephone Encounter (Signed)
 Copied from CRM #[id number]. Topic: Clinical - Prescription Issue >> Feb 15, 2024  4:02 PM Autumn Howard wrote: Reason for CRM: Autumn Howard from [facility] is calling in to discuss recent script for valACYclovir  (VALTREX ) 1000 MG tablet. Script says 3 times daily but pharmacist states that most recent renal function should only allow for 2 tablets max per day. Creatinine is 40.4 ml per min. Looking for call in verification or resend new script [phone] call back number

## 2024-02-16 ENCOUNTER — Telehealth: Payer: Self-pay

## 2024-02-16 NOTE — Telephone Encounter (Signed)
 Copied from CRM #[id number]. Topic: Clinical - Prescription Issue >> Feb 16, 2024  1:27 PM Roselie BROCKS wrote: Reason for CRM: Olam at [facility] states a refill was submitted for patient for valACYclovir  (VALTREX ) 1000 MG tablet for 1 gram 3 times a day, and according to health records the dosage is too high And should be 1 gram every 12 hours. Annett return call to [phone]

## 2024-02-17 ENCOUNTER — Other Ambulatory Visit: Payer: Self-pay

## 2024-02-17 MED ORDER — VALACYCLOVIR HCL 1 G PO TABS: 1000.0000 mg | ORAL_TABLET | Freq: Two times a day (BID) | ORAL | 1 refills | Status: AC

## 2024-02-17 NOTE — Telephone Encounter (Signed)
 OK bid Thx

## 2024-03-29 IMAGING — CT CT CHEST W/O CM
2 of 4 series · 15 of 36 positions shown, 18 images · non-contrast
Comparison: CT of the chest dated June 11, 2021 from [REDACTED] [facility]

CLINICAL DATA: Abnormal xray - lung nodule, >= 1 cm f/u on RLL
nodule on CT. Thx abnormal chest nodule in the right lower lobe,
follow-up study



[Series 2: thorax · axial · 0.60mm/px · z∈[+1356,+1628]mm · 12 of 162 slices shown, 15 images]
[im 13/162  mediastinal]
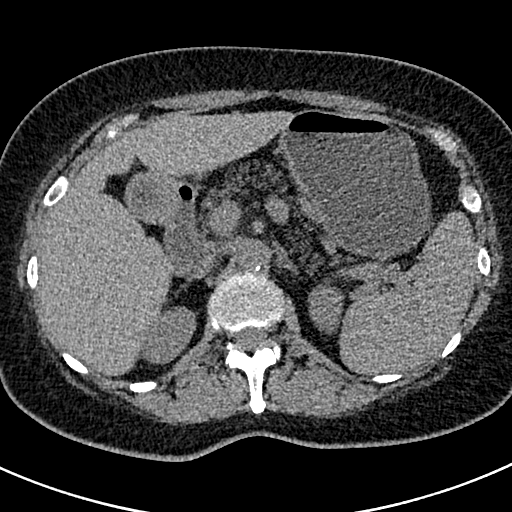
[im 13/162  lung]
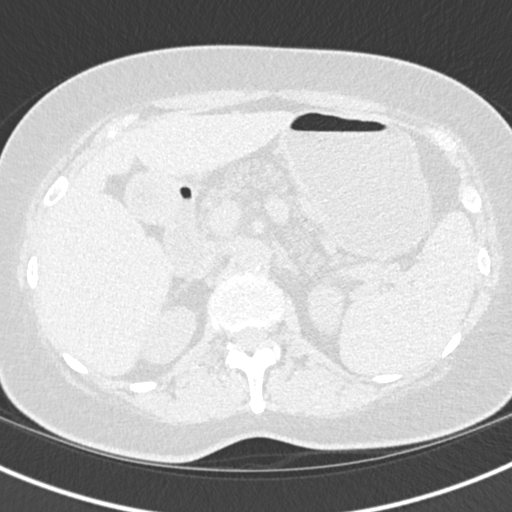
[im 25/162  lung]
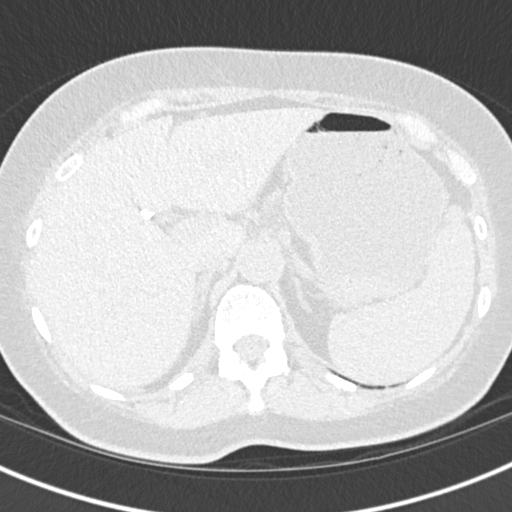
[im 38/162  lung]
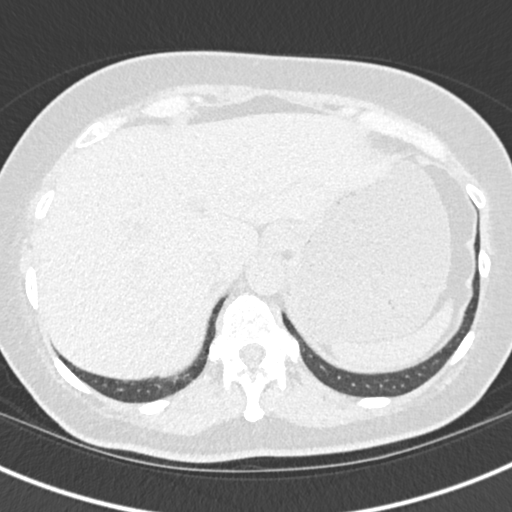
[im 50/162  lung]
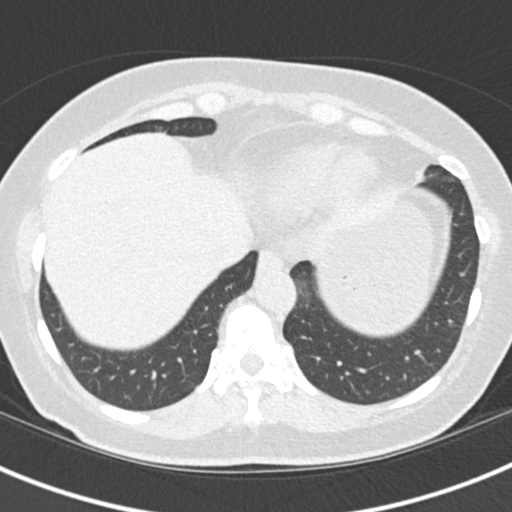
[im 62/162  mediastinal]
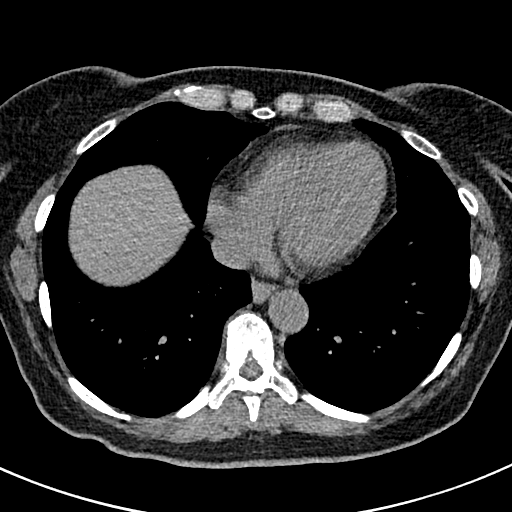
[im 62/162  lung]
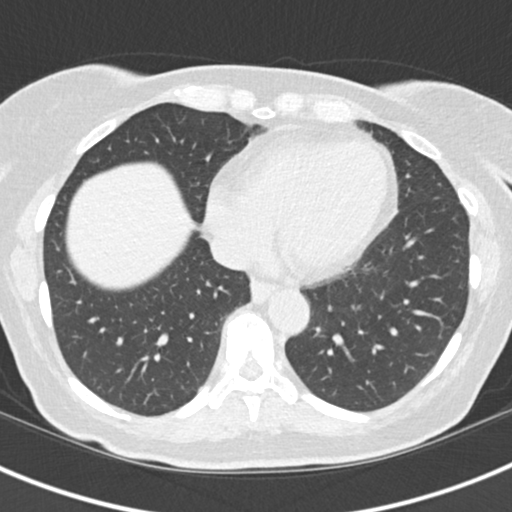
[im 75/162  lung]
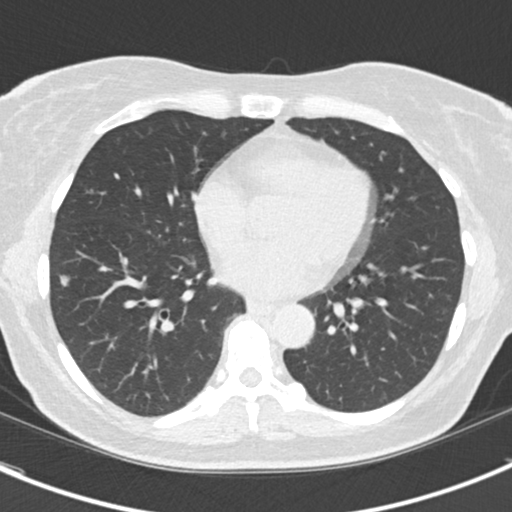
[im 87/162  lung]
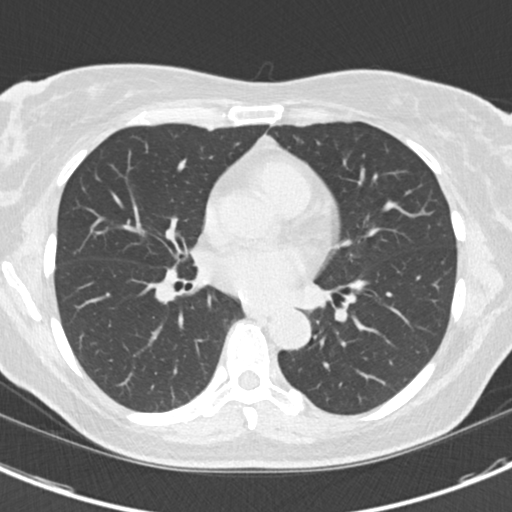
[im 100/162  lung]
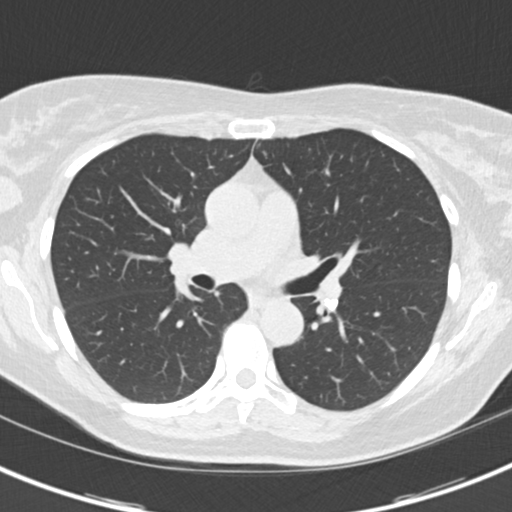
[im 112/162  mediastinal]
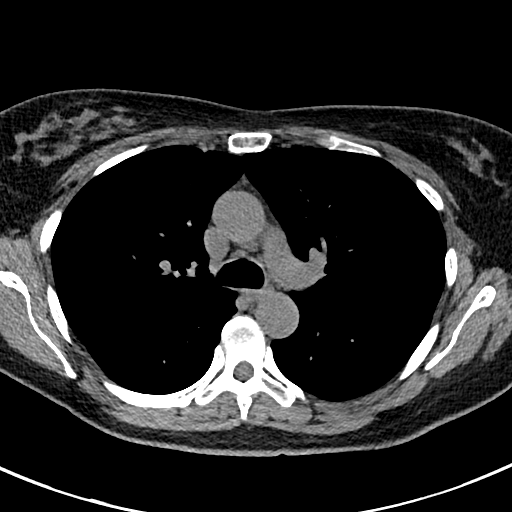
[im 112/162  lung]
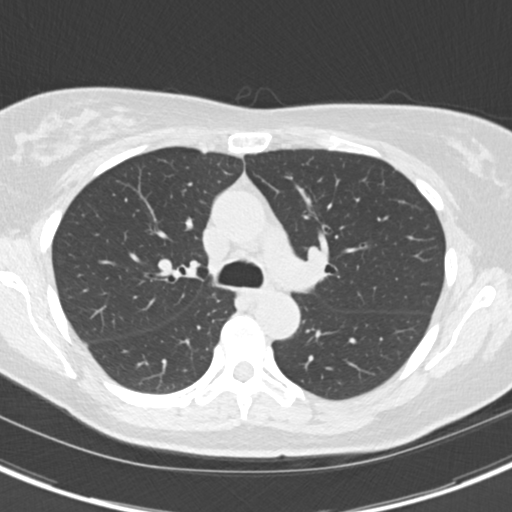
[im 124/162  lung]
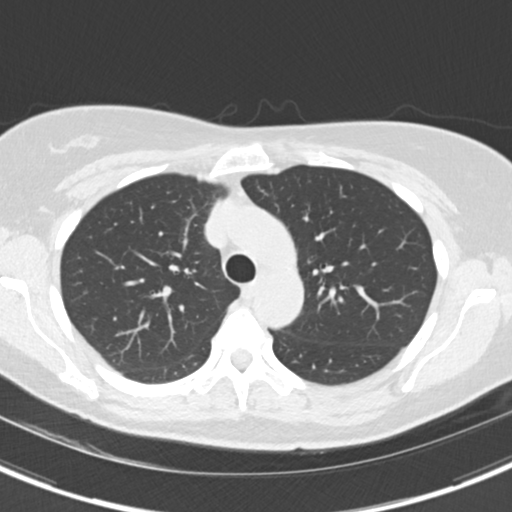
[im 137/162  lung]
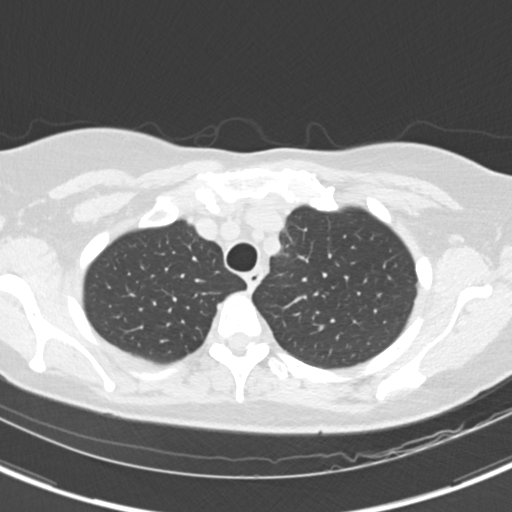
[im 149/162  lung]
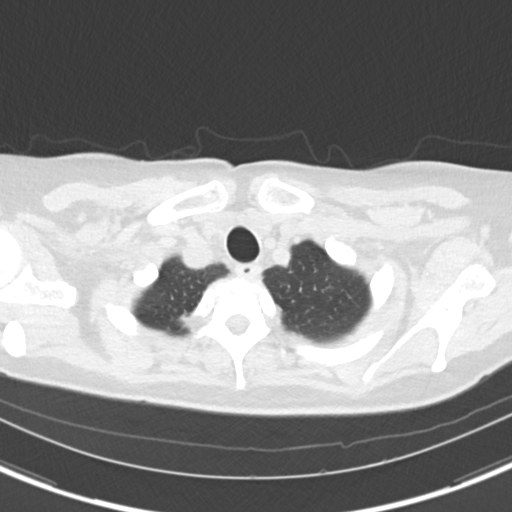

[Series 5: coronal · coronal · 0.63mm/px · 3 of 120 slices shown]
[im 24/120  lung]
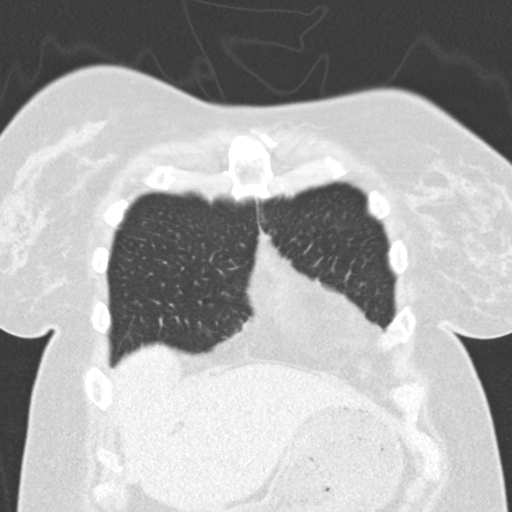
[im 48/120  lung]
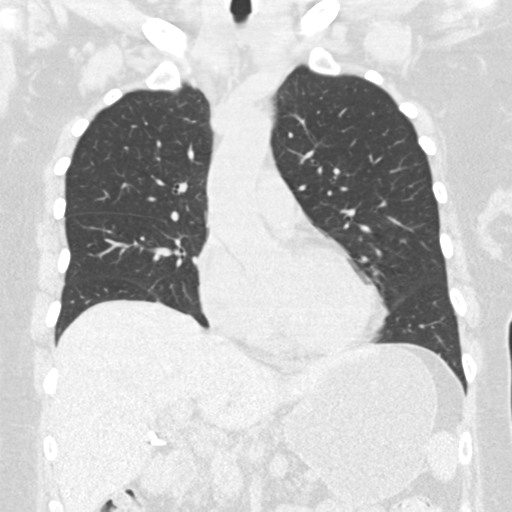
[im 72/120  lung]
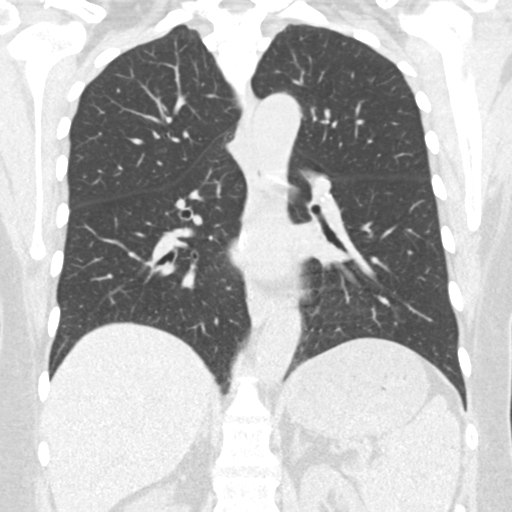

[15 of 36 positions shown; findings below may reference images not displayed]

FINDINGS: Cardiovascular: No significant vascular findings. Normal heart size.
No pericardial effusion.

Mediastinum/Nodes: Previously described 1 cm right hilar lymph node
is without significant interval change (image 61/2) no significant
mass seen. Left hilar lymph node calcification. Thyroid lobes have a
normal appearance. Esophagus is unremarkable.

Lungs/Pleura: There is an 8 mm smooth marginated subpleural nodule
in the superior segment of the right lower lobe, stable. No new
nodule seen. No consolidation or pleural effusion.

Upper Abdomen: No acute abnormality.  Status post cholecystectomy.

Musculoskeletal: No chest wall mass or suspicious bone lesions
identified. Focal coarse calcification in the right breast.
IMPRESSION: 8 mm smooth marginated subpleural nodule at the superior segment of
the right lower lobe is stable. No new nodule seen.

1 cm right hilar lymph node is stable.

## 2024-04-13 ENCOUNTER — Other Ambulatory Visit (HOSPITAL_COMMUNITY): Payer: Self-pay

## 2024-08-08 ENCOUNTER — Ambulatory Visit: Payer: PRIVATE HEALTH INSURANCE | Admitting: Internal Medicine
